# Patient Record
Sex: Female | Born: 1950 | Race: Black or African American | Hispanic: No | Marital: Single | Smoking: Current every day smoker
Health system: Southern US, Community
[De-identification: ages and names within clinical notes are randomized; demographics above are authoritative.]

## PROBLEM LIST (undated history)

## (undated) DIAGNOSIS — Z0000 Encounter for general adult medical examination without abnormal findings: Secondary | ICD-10-CM

## (undated) DIAGNOSIS — M1990 Unspecified osteoarthritis, unspecified site: Secondary | ICD-10-CM

## (undated) DIAGNOSIS — K219 Gastro-esophageal reflux disease without esophagitis: Secondary | ICD-10-CM

## (undated) DIAGNOSIS — E785 Hyperlipidemia, unspecified: Secondary | ICD-10-CM

## (undated) DIAGNOSIS — E041 Nontoxic single thyroid nodule: Secondary | ICD-10-CM

## (undated) DIAGNOSIS — M545 Low back pain, unspecified: Secondary | ICD-10-CM

## (undated) DIAGNOSIS — I10 Essential (primary) hypertension: Secondary | ICD-10-CM

## (undated) DIAGNOSIS — M2550 Pain in unspecified joint: Secondary | ICD-10-CM

## (undated) DIAGNOSIS — R1013 Epigastric pain: Secondary | ICD-10-CM

## (undated) DIAGNOSIS — Z833 Family history of diabetes mellitus: Secondary | ICD-10-CM

## (undated) DIAGNOSIS — R109 Unspecified abdominal pain: Secondary | ICD-10-CM

## (undated) DIAGNOSIS — F17200 Nicotine dependence, unspecified, uncomplicated: Secondary | ICD-10-CM

## (undated) DIAGNOSIS — H269 Unspecified cataract: Secondary | ICD-10-CM

## (undated) DIAGNOSIS — T7840XA Allergy, unspecified, initial encounter: Secondary | ICD-10-CM

## (undated) DIAGNOSIS — R10815 Periumbilic abdominal tenderness: Secondary | ICD-10-CM

## (undated) HISTORY — PX: POLYPECTOMY: SHX149

## (undated) HISTORY — DX: Essential (primary) hypertension: I10

## (undated) HISTORY — DX: Gastro-esophageal reflux disease without esophagitis: K21.9

## (undated) HISTORY — DX: Low back pain, unspecified: M54.50

## (undated) HISTORY — DX: Family history of diabetes mellitus: Z83.3

## (undated) HISTORY — DX: Encounter for general adult medical examination without abnormal findings: Z00.00

## (undated) HISTORY — DX: Hyperlipidemia, unspecified: E78.5

## (undated) HISTORY — DX: Nontoxic single thyroid nodule: E04.1

## (undated) HISTORY — PX: COLONOSCOPY: SHX174

## (undated) HISTORY — PX: ABDOMINAL HYSTERECTOMY: SHX81

## (undated) HISTORY — DX: Unspecified cataract: H26.9

## (undated) HISTORY — DX: Low back pain: M54.5

## (undated) HISTORY — DX: Allergy, unspecified, initial encounter: T78.40XA

## (undated) HISTORY — DX: Periumbilic abdominal tenderness: R10.815

## (undated) HISTORY — DX: Epigastric pain: R10.13

## (undated) HISTORY — PX: CHOLECYSTECTOMY: SHX55

## (undated) HISTORY — PX: OTHER SURGICAL HISTORY: SHX169

## (undated) HISTORY — DX: Nicotine dependence, unspecified, uncomplicated: F17.200

## (undated) HISTORY — DX: Pain in unspecified joint: M25.50

---

## 2005-03-30 ENCOUNTER — Emergency Department (HOSPITAL_COMMUNITY): Admission: EM | Admit: 2005-03-30 | Discharge: 2005-03-30 | Payer: Self-pay | Admitting: Emergency Medicine

## 2006-10-08 ENCOUNTER — Emergency Department (HOSPITAL_COMMUNITY): Admission: EM | Admit: 2006-10-08 | Discharge: 2006-10-08 | Payer: Self-pay | Admitting: Emergency Medicine

## 2010-08-15 ENCOUNTER — Encounter: Payer: Self-pay | Admitting: Emergency Medicine

## 2011-01-10 ENCOUNTER — Emergency Department (HOSPITAL_COMMUNITY)
Admission: EM | Admit: 2011-01-10 | Discharge: 2011-01-10 | Disposition: A | Discharge status: Home / Self Care | Payer: Self-pay | Attending: Emergency Medicine | Admitting: Emergency Medicine

## 2011-01-10 DIAGNOSIS — R51 Headache: Secondary | ICD-10-CM | POA: Insufficient documentation

## 2011-01-10 DIAGNOSIS — I10 Essential (primary) hypertension: Secondary | ICD-10-CM | POA: Insufficient documentation

## 2011-01-10 DIAGNOSIS — R609 Edema, unspecified: Secondary | ICD-10-CM | POA: Insufficient documentation

## 2012-09-03 ENCOUNTER — Other Ambulatory Visit: Payer: Self-pay | Admitting: *Deleted

## 2012-09-03 ENCOUNTER — Encounter: Payer: Self-pay | Admitting: Internal Medicine

## 2012-09-03 DIAGNOSIS — Z1231 Encounter for screening mammogram for malignant neoplasm of breast: Secondary | ICD-10-CM

## 2012-09-03 DIAGNOSIS — Z780 Asymptomatic menopausal state: Secondary | ICD-10-CM

## 2012-09-12 ENCOUNTER — Ambulatory Visit
Admission: RE | Admit: 2012-09-12 | Discharge: 2012-09-12 | Disposition: A | Discharge status: Home / Self Care | Payer: BC Managed Care – PPO | Source: Ambulatory Visit | Attending: Internal Medicine | Admitting: Internal Medicine

## 2012-09-12 ENCOUNTER — Other Ambulatory Visit: Payer: Self-pay | Admitting: Internal Medicine

## 2012-09-18 ENCOUNTER — Emergency Department (HOSPITAL_COMMUNITY)
Admission: EM | Admit: 2012-09-18 | Discharge: 2012-09-18 | Discharge status: Against Medical Advice | Payer: BC Managed Care – PPO | Attending: Emergency Medicine | Admitting: Emergency Medicine

## 2012-09-18 ENCOUNTER — Encounter (HOSPITAL_COMMUNITY): Payer: Self-pay | Admitting: Adult Health

## 2012-09-18 DIAGNOSIS — R1033 Periumbilical pain: Secondary | ICD-10-CM | POA: Insufficient documentation

## 2012-09-18 DIAGNOSIS — F17200 Nicotine dependence, unspecified, uncomplicated: Secondary | ICD-10-CM | POA: Insufficient documentation

## 2012-09-18 DIAGNOSIS — Z9089 Acquired absence of other organs: Secondary | ICD-10-CM | POA: Insufficient documentation

## 2012-09-18 DIAGNOSIS — I10 Essential (primary) hypertension: Secondary | ICD-10-CM | POA: Insufficient documentation

## 2012-09-18 DIAGNOSIS — R110 Nausea: Secondary | ICD-10-CM | POA: Insufficient documentation

## 2012-09-18 HISTORY — DX: Unspecified abdominal pain: R10.9

## 2012-09-18 HISTORY — DX: Unspecified osteoarthritis, unspecified site: M19.90

## 2012-09-18 HISTORY — DX: Essential (primary) hypertension: I10

## 2012-09-18 LAB — CBC WITH DIFFERENTIAL/PLATELET
Basophils Absolute: 0.1 10*3/uL (ref 0.0–0.1)
Basophils Relative: 1 % (ref 0–1)
Eosinophils Relative: 5 % (ref 0–5)
Lymphocytes Relative: 54 % — ABNORMAL HIGH (ref 12–46)
Lymphs Abs: 4 10*3/uL (ref 0.7–4.0)
MCH: 30.7 pg (ref 26.0–34.0)
Monocytes Relative: 9 % (ref 3–12)
Neutro Abs: 2.4 10*3/uL (ref 1.7–7.7)
Platelets: 324 10*3/uL (ref 150–400)
WBC: 7.4 10*3/uL (ref 4.0–10.5)

## 2012-09-18 LAB — COMPREHENSIVE METABOLIC PANEL
ALT: 9 U/L (ref 0–35)
AST: 13 U/L (ref 0–37)
CO2: 29 mEq/L (ref 19–32)
Calcium: 9.6 mg/dL (ref 8.4–10.5)
Chloride: 101 mEq/L (ref 96–112)
Creatinine, Ser: 0.75 mg/dL (ref 0.50–1.10)
GFR calc Af Amer: >90 mL/min (ref >90)
GFR calc non Af Amer: 88 mL/min — ABNORMAL LOW (ref >90)
Glucose, Bld: 123 mg/dL — ABNORMAL HIGH (ref 70–99)
Potassium: 3.7 mEq/L (ref 3.5–5.1)
Sodium: 139 mEq/L (ref 135–145)
Total Bilirubin: 0.4 mg/dL (ref 0.3–1.2)
Total Protein: 8 g/dL (ref 6.0–8.3)

## 2012-09-18 LAB — POCT I-STAT TROPONIN I: Troponin i, poc: 0 ng/mL (ref 0.00–0.08)

## 2012-09-18 NOTE — ED Notes (Signed)
No  Answer when called to triage

## 2012-09-18 NOTE — ED Notes (Signed)
Presents with abdominal pain at the umbilicus, denies radiation. Pain is described as "labor pains" and has been intermittent for 3 years. This episode began at 6 pm. Pain is associated with nausea. dnies diarrhea.

## 2012-10-03 ENCOUNTER — Ambulatory Visit
Admission: RE | Admit: 2012-10-03 | Discharge: 2012-10-03 | Disposition: A | Discharge status: Home / Self Care | Payer: BC Managed Care – PPO | Source: Ambulatory Visit | Attending: *Deleted | Admitting: *Deleted

## 2012-10-03 DIAGNOSIS — Z1231 Encounter for screening mammogram for malignant neoplasm of breast: Secondary | ICD-10-CM

## 2012-10-04 ENCOUNTER — Encounter: Payer: Self-pay | Admitting: Internal Medicine

## 2012-10-08 ENCOUNTER — Other Ambulatory Visit: Payer: Self-pay | Admitting: Internal Medicine

## 2012-10-11 ENCOUNTER — Ambulatory Visit (AMBULATORY_SURGERY_CENTER): Payer: BC Managed Care – PPO | Admitting: *Deleted

## 2012-10-11 ENCOUNTER — Encounter: Payer: Self-pay | Admitting: Internal Medicine

## 2012-10-11 VITALS — Ht 65.5 in | Wt 223.0 lb

## 2012-10-11 DIAGNOSIS — Z1211 Encounter for screening for malignant neoplasm of colon: Secondary | ICD-10-CM

## 2012-10-11 MED ORDER — PEG-KCL-NACL-NASULF-NA ASC-C 100 G PO SOLR: ORAL | Status: DC

## 2012-10-11 NOTE — Progress Notes (Signed)
Pt states she had a colonoscopy in [place] 10 years ago.  Doesn't remember where or who did the procedure.  No soy or egg allergy

## 2012-10-17 ENCOUNTER — Telehealth: Payer: Self-pay | Admitting: Internal Medicine

## 2012-10-17 NOTE — Telephone Encounter (Signed)
Pt will come by office (4th floor) 3/27 thur. And pick up $10 off coupon for MoviPrep

## 2012-10-18 ENCOUNTER — Ambulatory Visit (INDEPENDENT_AMBULATORY_CARE_PROVIDER_SITE_OTHER): Payer: BC Managed Care – PPO | Admitting: Internal Medicine

## 2012-10-18 ENCOUNTER — Encounter: Payer: Self-pay | Admitting: Internal Medicine

## 2012-10-18 ENCOUNTER — Other Ambulatory Visit: Payer: BC Managed Care – PPO

## 2012-10-18 VITALS — BP 116/62 | HR 64 | Temp 96.9°F | Resp 18 | Ht 64.5 in | Wt 224.0 lb

## 2012-10-18 DIAGNOSIS — K219 Gastro-esophageal reflux disease without esophagitis: Secondary | ICD-10-CM | POA: Insufficient documentation

## 2012-10-18 DIAGNOSIS — M1990 Unspecified osteoarthritis, unspecified site: Secondary | ICD-10-CM

## 2012-10-18 DIAGNOSIS — R739 Hyperglycemia, unspecified: Secondary | ICD-10-CM | POA: Insufficient documentation

## 2012-10-18 DIAGNOSIS — F17200 Nicotine dependence, unspecified, uncomplicated: Secondary | ICD-10-CM | POA: Insufficient documentation

## 2012-10-18 DIAGNOSIS — R7309 Other abnormal glucose: Secondary | ICD-10-CM

## 2012-10-18 DIAGNOSIS — I10 Essential (primary) hypertension: Secondary | ICD-10-CM | POA: Insufficient documentation

## 2012-10-18 DIAGNOSIS — R1013 Epigastric pain: Secondary | ICD-10-CM | POA: Insufficient documentation

## 2012-10-18 MED ORDER — LIDOCAINE HCL 1 % IJ SOLN: 1.0000 mL | Freq: Once | INTRAMUSCULAR | Status: DC

## 2012-10-18 MED ORDER — METHYLPREDNISOLONE ACETATE 80 MG/ML IJ SUSP: 80.0000 mg | Freq: Once | INTRAMUSCULAR | Status: DC

## 2012-10-18 NOTE — Progress Notes (Signed)
Subjective:    Patient ID: Rachel York, female    DOB: [birth year], 62 y.o.   MRN: [id number]  HPI 62 yo female who recently joined this practice was seen today for medical management of chronic issues.  She continues to have difficulty with left knee pain and knee giving out on her.  She is trying to lose weight.   See a/p portion of note for details. Has cscope next Tuesday and has to pay a lot of money for the prep despite her [employer] plan and was venting to me about this.   Also has diagnostic mammogram and ultrasound coming up after abnormality on screening.   Has fh/o diabetes.  Says boyfriend cooks for her since she has gotten quite motivated by her elevated hba1c result.     Review of Systems  Constitutional: Positive for activity change. Negative for appetite change, fatigue and unexpected weight change.  HENT: Negative for congestion and sinus pressure.   Eyes: Negative for visual disturbance.  Respiratory: Negative for cough, chest tightness and shortness of breath.   Cardiovascular: Negative for chest pain, palpitations and leg swelling.  Gastrointestinal: Negative for nausea, vomiting, diarrhea, constipation and abdominal distention.  Endocrine: Positive for polyphagia. Negative for polydipsia and polyuria.  Genitourinary: Negative for dysuria, urgency and difficulty urinating.  Musculoskeletal: Positive for back pain, joint swelling and gait problem.  Skin: Negative for rash.  Allergic/Immunologic: Negative for immunocompromised state.  Neurological: Negative for dizziness, weakness and headaches.  Hematological: Does not bruise/bleed easily.  Psychiatric/Behavioral: Negative for confusion. The patient is not nervous/anxious.        Objective:   Physical Exam  Vitals reviewed. Constitutional: She is oriented to person, place, and time. She appears well-developed and well-nourished.  Overweight AA female in NAD  HENT:  Head: Normocephalic and atraumatic.   Cardiovascular: Normal rate, regular rhythm, normal heart sounds and intact distal pulses.   Pulmonary/Chest: Effort normal and breath sounds normal.  Abdominal: Soft. Bowel sounds are normal. She exhibits no distension and no mass. There is no tenderness.  Musculoskeletal: She exhibits tenderness. She exhibits no edema.  Midline tenderness of left knee. Consent obtained--risks benefits, alternatives discussed,  area was cleansed and sprayed with bupivicaine and left knee steroid injection performed with depomedrol and lidocaine 1%  1cc each medial approach.  Pt noted some immediate relief.  Neurological: She is alert and oriented to person, place, and time.  Skin: Skin is warm and dry.  Psychiatric: She has a normal mood and affect. Her behavior is normal. Judgment and thought content normal.      Assessment & Plan:  GERD (gastroesophageal reflux disease) prilosec only relieves intensity of her pain in her abdomen.   Essential hypertension, benign Sprint study wants bp in 140 range.  With 6.25mg  hctz makes her feel worse.  Feels better and less edema when bp is lower like 117.  Feels better with 12.5mg .  Will continue microzide 12.5mg  po daily as she was actually taking at home.  Abdominal pain, epigastric Has had her abdominal pain 2-3 years now.  Sometimes feels like there's a lump in her abdomen.  H pylori test was negative.  prilosec not very helpful.    Osteoarthritis Left knee has tricompartmental arthritis (moderate).   Tramadol 50mg  twice a day is working well for her pain.   Does want steroid injection in left knee.   Hyperglycemia Has changed her eating habits.  Joined [employer].  Lost 7 lbs since last visit.   Tobacco  use disorder Is considering quitting smoking.  Likes the calming effect of smoking.  Says once she gets under 220lb, she will consider quitting.

## 2012-10-18 NOTE — Assessment & Plan Note (Addendum)
Sprint study wants bp in 140 range.  With 6.25mg  hctz makes her feel worse.  Feels better and less edema when bp is lower like 117.  Feels better with 12.5mg .  Will continue microzide 12.5mg  po daily as she was actually taking at home.

## 2012-10-18 NOTE — Assessment & Plan Note (Signed)
prilosec only relieves intensity of her pain in her abdomen.

## 2012-10-18 NOTE — Assessment & Plan Note (Addendum)
Left knee has tricompartmental arthritis (moderate).   Tramadol 50mg  twice a day is working well for her pain.   Does want steroid injection in left knee.

## 2012-10-18 NOTE — Assessment & Plan Note (Signed)
Has changed her eating habits.  Joined [employer].  Lost 7 lbs since last visit.

## 2012-10-18 NOTE — Assessment & Plan Note (Signed)
Has had her abdominal pain 2-3 years now.  Sometimes feels like there's a lump in her abdomen.  H pylori test was negative.  prilosec not very helpful.

## 2012-10-18 NOTE — Assessment & Plan Note (Signed)
Is considering quitting smoking.  Likes the calming effect of smoking.  Says once she gets under 220lb, she will consider quitting.

## 2012-10-19 ENCOUNTER — Ambulatory Visit
Admission: RE | Admit: 2012-10-19 | Discharge: 2012-10-19 | Disposition: A | Discharge status: Home / Self Care | Payer: BC Managed Care – PPO | Source: Ambulatory Visit | Attending: Internal Medicine | Admitting: Internal Medicine

## 2012-10-19 DIAGNOSIS — R928 Other abnormal and inconclusive findings on diagnostic imaging of breast: Secondary | ICD-10-CM

## 2012-10-22 ENCOUNTER — Encounter: Payer: Self-pay | Admitting: Internal Medicine

## 2012-10-23 ENCOUNTER — Ambulatory Visit (AMBULATORY_SURGERY_CENTER): Payer: BC Managed Care – PPO | Admitting: Internal Medicine

## 2012-10-23 ENCOUNTER — Encounter: Payer: Self-pay | Admitting: Internal Medicine

## 2012-10-23 VITALS — BP 143/94 | HR 57 | Temp 96.3°F | Resp 31 | Ht 65.5 in | Wt 223.0 lb

## 2012-10-23 DIAGNOSIS — D126 Benign neoplasm of colon, unspecified: Secondary | ICD-10-CM

## 2012-10-23 DIAGNOSIS — Z1211 Encounter for screening for malignant neoplasm of colon: Secondary | ICD-10-CM

## 2012-10-23 MED ORDER — SODIUM CHLORIDE 0.9 % IV SOLN: 500.0000 mL | INTRAVENOUS | Status: DC

## 2012-10-23 NOTE — Patient Instructions (Addendum)
YOU HAD AN ENDOSCOPIC PROCEDURE TODAY AT THE [facility]: Refer to the procedure report that was given to you for any specific questions about what was found during the examination.  If the procedure report does not answer your questions, please call your gastroenterologist to clarify.  If you requested that your care partner not be given the details of your procedure findings, then the procedure report has been included in a sealed envelope for you to review at your convenience later.  YOU SHOULD EXPECT: Some feelings of bloating in the abdomen. Passage of more gas than usual.  Walking can help get rid of the air that was put into your GI tract during the procedure and reduce the bloating. If you had a lower endoscopy (such as a colonoscopy or flexible sigmoidoscopy) you may notice spotting of blood in your stool or on the toilet paper. If you underwent a bowel prep for your procedure, then you may not have a normal bowel movement for a few days.  DIET: Your first meal following the procedure should be a light meal and then it is ok to progress to your normal diet.  A half-sandwich or bowl of soup is an example of a good first meal.  Heavy or fried foods are harder to digest and may make you feel nauseous or bloated.  Likewise meals heavy in dairy and vegetables can cause extra gas to form and this can also increase the bloating.  Drink plenty of fluids but you should avoid alcoholic beverages for 24 hours.  ACTIVITY: Your care partner should take you home directly after the procedure.  You should plan to take it easy, moving slowly for the rest of the day.  You can resume normal activity the day after the procedure however you should NOT DRIVE or use heavy machinery for 24 hours (because of the sedation medicines used during the test).    SYMPTOMS TO REPORT IMMEDIATELY: A gastroenterologist can be reached at any hour.  During normal business hours, 8:30 AM to 5:00 PM Monday through Friday,  call [phone].  After hours and on weekends, please call the GI answering service at [phone] emergency number  who will take a message and have the physician on call contact you.   Following lower endoscopy (colonoscopy or flexible sigmoidoscopy):  Excessive amounts of blood in the stool  Significant tenderness or worsening of abdominal pains  Swelling of the abdomen that is new, acute  Fever of 100F or higher  FOLLOW UP: If any biopsies were taken you will be contacted by phone or by letter within the next 1-3 weeks.  Call your gastroenterologist if you have not heard about the biopsies in 3 weeks.  Our staff will call the home number listed on your records the next business day following your procedure to check on you and address any questions or concerns that you may have at that time regarding the information given to you following your procedure. This is a courtesy call and so if there is no answer at the home number and we have not heard from you through the emergency physician on call, we will assume that you have returned to your regular daily activities without incident.  SIGNATURES/CONFIDENTIALITY: You and/or your care partner have signed paperwork which will be entered into your electronic medical record.  These signatures attest to the fact that that the information above on your After Visit Summary has been reviewed and is understood.  Full responsibility of the   confidentiality of this discharge information lies with you and/or your care-partner.  Handout on polyps  Strictly hold all aspirins, aspirin containing products, and all anti inflammatory medicines like advil, motrin, aleve or any prescription medicines for 2 weeks. Only take tylenol for pain x 2 weeks. You may resume these medicines on 11-06-12.  Please call [phone] in the next 1-3 days to schedule an office visit with Dr Rhea Belton in 2-3 weeks.  If polyps are adenomatous, you will need genetic testing. If this  is needed, dr pyrtle's office will call you.

## 2012-10-23 NOTE — Progress Notes (Signed)
Patient did not experience any of the following events: a burn prior to discharge; a fall within the facility; wrong site/side/patient/procedure/implant event; or a hospital transfer or hospital admission upon discharge from the facility. (G8907)Patient did not have preoperative order for IV antibiotic SSI prophylaxis. (G8918) ewm 

## 2012-10-23 NOTE — Op Note (Signed)
[facility] [address] [place], [address]   COLONOSCOPY PROCEDURE REPORT  PATIENT: Rachel, York  MR#: [id number] BIRTHDATE: 1950/09/24 , 62  yrs. old GENDER: Female ENDOSCOPIST: Beverley Fiedler, MD REFERRED FA:OZHY, Tiffany PROCEDURE DATE:  10/23/2012 PROCEDURE:   Colonoscopy with snare polypectomy ASA CLASS:   Class II INDICATIONS:elevated risk screening, Patient's personal history of colon polyps, and Last colonoscopy performed 10 years ago. MEDICATIONS: MAC sedation, administered by CRNA and propofol (Diprivan) 500mg  IV  DESCRIPTION OF PROCEDURE:   After the risks benefits and alternatives of the procedure were thoroughly explained, informed consent was obtained.  A digital rectal exam revealed no rectal mass.   The LB [id number]  endoscope was introduced through the anus and advanced to the cecum, which was identified by both the appendix and ileocecal valve. No adverse events experienced. The quality of the prep was good, using MoviPrep  The instrument was then slowly withdrawn as the colon was fully examined.      COLON FINDINGS: A sessile polyp measuring 5 mm in size was found at the cecum.  A polypectomy was performed with a cold snare.  The resection was complete and the polyp tissue was completely retrieved.   Nine sessile polyps measuring 5-10 mm in size were found in the transverse colon.  Polypectomy was performed using cold snare (8) and using hot snare (1).  All resections were complete and all polyp tissue was completely retrieved.   Twelve sessile polyps measuring 5-12 mm in size were found in the descending colon, sigmoid colon, and rectum.  Polypectomy was performed using cold snare (11) and using hot snare (1 sigmoid). All resections were complete and all polyp tissue was completely retrieved.   An innumerable number of sessile polyps measuring 3-7 mm in size were found in the transverse colon, descending colon, sigmoid  colon, and rectum (too numerous to remove today/polypectomy not attempted).  Retroflexed views revealed no abnormalities. The time to cecum=4 minutes 32 seconds.  Withdrawal time=32 minutes 00 seconds.  The scope was withdrawn and the procedure completed.  COMPLICATIONS: There were no complications.  ENDOSCOPIC IMPRESSION: 1.   Sessile polyp measuring 5 mm in size was found at the cecum; polypectomy was performed with a cold snare 2.   Nine sessile polyps measuring 5-10 mm in size were found in the transverse colon; Polypectomy was performed using cold snare and using hot snare 3.   Twelve sessile polyps measuring 5-12 mm in size were found in the descending colon, sigmoid colon, and rectum; Polypectomy was performed using cold snare and using hot snare 4.   Innumerable number of sessile polyps measuring 3-7 mm in size were found in the transverse colon, descending colon, sigmoid colon, and rectum; polypectomy not attempted  RECOMMENDATIONS: 1.  Strictly hold aspirin, aspirin products, and anti-inflammatory medication for 2 weeks. 2.  Await pathology results 3.  Timing of repeat colonoscopy will be determined by pathology findings. 4.  You will receive a letter within 1-2 weeks with the results of your biopsy as well as final recommendations.  Please call my office if you have not received a letter after 3 weeks. 5.  If polyps adenomatous, the patient will need of medical genetics referral 6.  Office followup in 2-3 weeks   eSigned:  Beverley Fiedler, MD 10/23/2012 11:27 AM   cc: The Patient   PATIENT NAME:  Rachel, York MR#: [id number]

## 2012-10-24 ENCOUNTER — Telehealth: Payer: Self-pay | Admitting: *Deleted

## 2012-10-24 NOTE — Telephone Encounter (Signed)
  Follow up Call-  Call back number 10/23/2012  Post procedure Call Back phone  # [phone]  Permission to leave phone message Yes     Patient questions:  Do you have a fever, pain , or abdominal swelling? no Pain Score  0 *  Have you tolerated food without any problems? yes  Have you been able to return to your normal activities? yes  Do you have any questions about your discharge instructions: Diet   no Medications  no Follow up visit  no  Do you have questions or concerns about your Care? no  Actions: * If pain score is 4 or above: No action needed, pain <4.

## 2012-10-31 ENCOUNTER — Other Ambulatory Visit: Payer: Self-pay | Admitting: *Deleted

## 2012-10-31 ENCOUNTER — Encounter: Payer: Self-pay | Admitting: Internal Medicine

## 2012-11-01 NOTE — Progress Notes (Signed)
Colonoscopy with multiple polyps removed and some still left behind.  Await pathology to determine when follow up needed.

## 2012-11-05 ENCOUNTER — Telehealth: Payer: Self-pay | Admitting: *Deleted

## 2012-11-05 DIAGNOSIS — D369 Benign neoplasm, unspecified site: Secondary | ICD-10-CM

## 2012-11-05 DIAGNOSIS — Z86010 Personal history of colonic polyps: Secondary | ICD-10-CM

## 2012-11-05 NOTE — Telephone Encounter (Signed)
Letter from: Beverley Fiedler   Reason for Letter: Results Review   Comments: Recall colonoscopy to be discussed in clinic 11/02/12 sent JLR   Aram Beecham, medical genetics referral   lmom for pt to call back to discuss Genetics Referral

## 2012-11-06 ENCOUNTER — Telehealth: Payer: Self-pay | Admitting: Genetic Counselor

## 2012-11-06 NOTE — Telephone Encounter (Signed)
S/W PT IN RE TO GENETIC APPT 05/14 @ 10 W/CATHERINE FINE.  WELCOME PACKET MAILED.

## 2012-11-06 NOTE — Telephone Encounter (Signed)
Discussed genetic referral with the pt and the need; pt stated understanding.

## 2012-11-22 ENCOUNTER — Encounter: Payer: Self-pay | Admitting: Internal Medicine

## 2012-11-28 ENCOUNTER — Ambulatory Visit (INDEPENDENT_AMBULATORY_CARE_PROVIDER_SITE_OTHER): Payer: BC Managed Care – PPO | Admitting: Internal Medicine

## 2012-11-28 ENCOUNTER — Encounter: Payer: Self-pay | Admitting: Internal Medicine

## 2012-11-28 VITALS — BP 100/64 | HR 64 | Ht 65.5 in | Wt 220.4 lb

## 2012-11-28 DIAGNOSIS — Z860101 Personal history of adenomatous and serrated colon polyps: Secondary | ICD-10-CM | POA: Insufficient documentation

## 2012-11-28 DIAGNOSIS — Z7189 Other specified counseling: Secondary | ICD-10-CM

## 2012-11-28 DIAGNOSIS — Z716 Tobacco abuse counseling: Secondary | ICD-10-CM

## 2012-11-28 DIAGNOSIS — F17200 Nicotine dependence, unspecified, uncomplicated: Secondary | ICD-10-CM

## 2012-11-28 DIAGNOSIS — Z86010 Personal history of colonic polyps: Secondary | ICD-10-CM | POA: Insufficient documentation

## 2012-11-28 NOTE — Patient Instructions (Addendum)
Dr. Rhea Belton recommends you have a repeat colonoscopy in September 2014  Please have Genetics send their office note to Dr. Rhea Belton they can fax it to : [phone]                                               We are excited to introduce MyChart, a new best-in-class service that provides you online access to important information in your electronic medical record. We want to make it easier for you to view your health information - all in one secure location - when and where you need it. We expect MyChart will enhance the quality of care and service we provide.  When you register for MyChart, you can:    View your test results.    Request appointments and receive appointment reminders via email.    Request medication renewals.    View your medical history, allergies, medications and immunizations.    Communicate with your physician's office through a password-protected site.    Conveniently print information such as your medication lists.  To find out if MyChart is right for you, please talk to a member of our clinical staff today. We will gladly answer your questions about this free health and wellness tool.  If you are age 93 or older and want a member of your family to have access to your record, you must provide written consent by completing a proxy form available at our office. Please speak to our clinical staff about guidelines regarding accounts for patients younger than age 79.  As you activate your MyChart account and need any technical assistance, please call the MyChart technical support line at [phone]) or email your question to [email or web address]. If you email your question(s), please include your name, a return phone number and the best time to reach you.  If you have non-urgent health-related questions, you can send a message to our office through MyChart at [email or web address]. If you have a medical emergency, call 911.  Thank you for using  MyChart as your new health and wellness resource!   MyChart licensed from [employer],  [id number]. Patents Pending.

## 2012-11-28 NOTE — Progress Notes (Signed)
Patient ID: Rachel York, female   DOB: 16-Jan-1951, 62 y.o.   MRN: [id number] HPI: Rachel York is a 62 year old female with a past medical history of GERD, tobacco use, hypertension, and now adenomatous colon polyps who is seen after undergoing a screening colonoscopy in April 2014. She is here today with her daughter and husband. She reports she feels well. She denies abdominal pain. No change in her bowel habits, and she is having 1-2 formed bowel movements daily. No rectal bleeding or melena. No nausea or vomiting. Good appetite. She denies trouble with heartburn. No trouble swallowing. She does report for about 3 years lower abdominal pain which was cramping in nature. This was episodic, but when occurring last at 4-5 days. She describes this pain as severe and at times "doubling over". This pain has been absent since her colonoscopy.  Colonoscopy performed on 10/23/2012 revealed multiple polyps throughout the colon. 22 polyps were removed measuring 5-12 mm in size throughout the colon. There were also innumerable sessile polyps 3-7 cm found in the colon which were not removed due to the length of procedure and multiplicity of polyps. She was referred for genetics evaluation and this is pending for 12/05/2012.  She reports no known history of colon cancer. She has no siblings and 2 kids.  She did recall a colonoscopy greater than 10 years ago and wrote of Rachel York. She had multiple polyps at that time but was told they were not cancer.  Patient Active Problem List   Diagnosis Date Noted  . Hx of adenomatous colonic polyps 11/28/2012  . GERD (gastroesophageal reflux disease) 10/18/2012  . Abdominal pain, epigastric 10/18/2012  . Osteoarthritis 10/18/2012  . Tobacco use disorder 10/18/2012  . Essential hypertension, benign 10/18/2012  . Hyperglycemia 10/18/2012    Past Surgical History  Procedure Laterality Date  . Cholecystectomy    . Abdominal hysterectomy    . Colonoscopy      Current  Outpatient Prescriptions  Medication Sig Dispense Refill  . hydrochlorothiazide (MICROZIDE) 12.5 MG capsule Take 6.25 mg by mouth daily.      Marland Kitchen omeprazole (PRILOSEC) 20 MG capsule Take 20 mg by mouth daily.       No current facility-administered medications for this visit.    No Known Allergies  Family History  Problem Relation Age of Onset  . Colon cancer Neg Hx   . Esophageal cancer Neg Hx   . Rectal cancer Neg Hx   . Stomach cancer Neg Hx   . Cancer Mother   . Diabetes Father     History  Substance Use Topics  . Smoking status: Current Every Day Smoker -- 0.50 packs/day    Types: Cigarettes  . Smokeless tobacco: Never Used  . Alcohol Use: No    ROS: As per history of present illness, otherwise negative  BP 100/64  Pulse 64  Ht 5' 5.5" (1.664 m)  Wt 220 lb 6.4 oz (99.973 kg)  BMI 36.11 kg/m2 Constitutional: Well-developed and well-nourished. No distress. HEENT: Normocephalic and atraumatic. No scleral icterus. Abdominal: Soft, nontender, nondistended. Bowel sounds active throughout. There are no masses palpable. No hepatosplenomegaly. Extremities: no clubbing, cyanosis, or edema Neurological: Alert and oriented to person place and time. Skin: Skin is warm and dry. No rashes noted. Psychiatric: Normal mood and affect. Behavior is normal.  RELEVANT LABS AND IMAGING: CBC    Component Value Date/Time   WBC 7.4 09/18/2012 2036   RBC 5.11 09/18/2012 2036   HGB 15.7* 09/18/2012 2036   HCT 43.8  09/18/2012 2036   PLT 324 09/18/2012 2036   MCV 85.7 09/18/2012 2036   MCH 30.7 09/18/2012 2036   MCHC 35.8 09/18/2012 2036   RDW 13.3 09/18/2012 2036   LYMPHSABS 4.0 09/18/2012 2036   MONOABS 0.7 09/18/2012 2036   EOSABS 0.3 09/18/2012 2036   BASOSABS 0.1 09/18/2012 2036    CMP     Component Value Date/Time   NA 139 09/18/2012 2036   K 3.7 09/18/2012 2036   CL 101 09/18/2012 2036   CO2 29 09/18/2012 2036   GLUCOSE 123* 09/18/2012 2036   BUN 15 09/18/2012 2036   CREATININE 0.75  09/18/2012 2036   CALCIUM 9.6 09/18/2012 2036   PROT 8.0 09/18/2012 2036   ALBUMIN 3.6 09/18/2012 2036   AST 13 09/18/2012 2036   ALT 9 09/18/2012 2036   ALKPHOS 100 09/18/2012 2036   BILITOT 0.4 09/18/2012 2036   GFRNONAA 88* 09/18/2012 2036   GFRAA >90 09/18/2012 2036    ASSESSMENT/PLAN:  62 year old female with a past medical history of GERD, tobacco use, hypertension, and now adenomatous colon polyps who is seen after undergoing a screening colonoscopy in April 2014.   1.  Hx of multiple adenomatous colon polyps -- we had a long and thorough discussion concerning colon polyps. She is aware that she had multiple adenomatous polyps but none with high-grade dysplasia or cancer. She also is aware that there were multiple polyps remaining in her colon which were not resected at her last colonoscopy. She does plan to meet with genetics to investigate the possibility of hereditary polyposis syndrome. We discussed options for dealing with her colon polyps which include frequent surveillance colonoscopies, though with this option it would be unlikely that all polyps could be removed. We discussed that it is difficult to tell endoscopically which polyps are adenomatous, serrated, versus hyperplastic.  We did discuss colectomy, which would be the only way to definitely remove all of her colon polyps. She asked questions regarding the need for an ostomy, and should she elect this option in the future, subtotal colectomy with ileorectal anastomosis would likely be sufficient. She is aware if this were done we would need to periodically survey her rectum for polyps.  She does smoke, and I strongly encouraged her to stop smoking is correlated with colon polyps. I recommended that her children get screening colonoscopies, beginning at age 105. Her kids may need earlier screening if she is found to have a genetic polyp syndrome.   --For now we will plan a repeat colonoscopy for polyp surveillance in 3-6 months. This will  allow time for her genetic counseling. She will call me with any questions or concerns before this.  2.  Lower abd pain -- unclear why this resolved after colonoscopy. For now she is thankful that the pain is gone, and will let us know if it returns

## 2012-12-05 ENCOUNTER — Other Ambulatory Visit: Payer: BC Managed Care – PPO | Admitting: Lab

## 2012-12-11 ENCOUNTER — Other Ambulatory Visit: Payer: Self-pay | Admitting: Internal Medicine

## 2012-12-11 DIAGNOSIS — N6310 Unspecified lump in the right breast, unspecified quadrant: Secondary | ICD-10-CM

## 2012-12-13 ENCOUNTER — Ambulatory Visit: Payer: BC Managed Care – PPO | Admitting: Internal Medicine

## 2012-12-19 ENCOUNTER — Encounter: Payer: Self-pay | Admitting: *Deleted

## 2012-12-19 DIAGNOSIS — E041 Nontoxic single thyroid nodule: Secondary | ICD-10-CM | POA: Insufficient documentation

## 2012-12-20 ENCOUNTER — Encounter: Payer: Self-pay | Admitting: Internal Medicine

## 2012-12-20 ENCOUNTER — Ambulatory Visit: Payer: Self-pay | Admitting: Internal Medicine

## 2012-12-20 ENCOUNTER — Ambulatory Visit (INDEPENDENT_AMBULATORY_CARE_PROVIDER_SITE_OTHER): Payer: BC Managed Care – PPO | Admitting: Internal Medicine

## 2012-12-20 VITALS — BP 140/78 | HR 88 | Temp 98.5°F | Resp 14 | Ht 65.5 in | Wt 222.0 lb

## 2012-12-20 DIAGNOSIS — K219 Gastro-esophageal reflux disease without esophagitis: Secondary | ICD-10-CM

## 2012-12-20 DIAGNOSIS — F17200 Nicotine dependence, unspecified, uncomplicated: Secondary | ICD-10-CM

## 2012-12-20 DIAGNOSIS — M1990 Unspecified osteoarthritis, unspecified site: Secondary | ICD-10-CM

## 2012-12-20 DIAGNOSIS — Z860100 Personal history of colon polyps, unspecified: Secondary | ICD-10-CM

## 2012-12-20 DIAGNOSIS — R739 Hyperglycemia, unspecified: Secondary | ICD-10-CM

## 2012-12-20 DIAGNOSIS — E041 Nontoxic single thyroid nodule: Secondary | ICD-10-CM

## 2012-12-20 DIAGNOSIS — I10 Essential (primary) hypertension: Secondary | ICD-10-CM

## 2012-12-20 DIAGNOSIS — R7309 Other abnormal glucose: Secondary | ICD-10-CM

## 2012-12-20 NOTE — Patient Instructions (Signed)
Diabetes Meal Planning Guide The diabetes meal planning guide is a tool to help you plan your meals and snacks. It is important for people with diabetes to manage their blood glucose (sugar) levels. Choosing the right foods and the right amounts throughout your day will help control your blood glucose. Eating right can even help you improve your blood pressure and reach or maintain a healthy weight. CARBOHYDRATE COUNTING MADE EASY When you eat carbohydrates, they turn to sugar. This raises your blood glucose level. Counting carbohydrates can help you control this level so you feel better. When you plan your meals by counting carbohydrates, you can have more flexibility in what you eat and balance your medicine with your food intake. Carbohydrate counting simply means adding up the total amount of carbohydrate grams in your meals and snacks. Try to eat about the same amount at each meal. Foods with carbohydrates are listed below. Each portion below is 1 carbohydrate serving or 15 grams of carbohydrates. Ask your dietician how many grams of carbohydrates you should eat at each meal or snack. Grains and Starches  1 slice bread.   English muffin or hotdog/hamburger bun.   cup cold cereal (unsweetened).   cup cooked pasta or rice.   cup starchy vegetables (corn, potatoes, peas, beans, winter squash).  1 tortilla (6 inches).   bagel.  1 waffle or pancake (size of a CD).   cup cooked cereal.  4 to 6 small crackers. *Whole grain is recommended. Fruit  1 cup fresh unsweetened berries, melon, papaya, pineapple.  1 small fresh fruit.   banana or mango.   cup fruit juice (4 oz unsweetened).   cup canned fruit in natural juice or water.  2 tbs dried fruit.  12 to 15 grapes or cherries. Milk and Yogurt  1 cup fat-free or 1% milk.  1 cup soy milk.  6 oz light yogurt with sugar-free sweetener.  6 oz low-fat soy yogurt.  6 oz plain yogurt. Vegetables  1 cup raw or  cup  cooked is counted as 0 carbohydrates or a "free" food.  If you eat 3 or more servings at 1 meal, count them as 1 carbohydrate serving. Other Carbohydrates   oz chips or pretzels.   cup ice cream or frozen yogurt.   cup sherbet or sorbet.  2 inch square cake, no frosting.  1 tbs honey, sugar, jam, jelly, or syrup.  2 small cookies.  3 squares of graham crackers.  3 cups popcorn.  6 crackers.  1 cup broth-based soup.  Count 1 cup casserole or other mixed foods as 2 carbohydrate servings.  Foods with less than 20 calories in a serving may be counted as 0 carbohydrates or a "free" food. You may want to purchase a book or computer software that lists the carbohydrate gram counts of different foods. In addition, the nutrition facts panel on the labels of the foods you eat are a good source of this information. The label will tell you how big the serving size is and the total number of carbohydrate grams you will be eating per serving. Divide this number by 15 to obtain the number of carbohydrate servings in a portion. Remember, 1 carbohydrate serving equals 15 grams of carbohydrate. SERVING SIZES Measuring foods and serving sizes helps you make sure you are getting the right amount of food. The list below tells how big or small some common serving sizes are.  1 oz.........4 stacked dice.  3 oz.........Deck of cards.  1 tsp........Tip   of little finger.  1 tbs........Thumb.  2 tbs........Golf ball.   cup.......Half of a fist.  1 cup........A fist. SAMPLE DIABETES MEAL PLAN Below is a sample meal plan that includes foods from the grain and starches, dairy, vegetable, fruit, and meat groups. A dietician can individualize a meal plan to fit your calorie needs and tell you the number of servings needed from each food group. However, controlling the total amount of carbohydrates in your meal or snack is more important than making sure you include all of the food groups at every  meal. You may interchange carbohydrate containing foods (dairy, starches, and fruits). The meal plan below is an example of a 2000 calorie diet using carbohydrate counting. This meal plan has 17 carbohydrate servings. Breakfast  1 cup oatmeal (2 carb servings).   cup light yogurt (1 carb serving).  1 cup blueberries (1 carb serving).   cup almonds. Snack  1 large apple (2 carb servings).  1 low-fat string cheese stick. Lunch  Chicken breast salad.  1 cup spinach.   cup chopped tomatoes.  2 oz chicken breast, sliced.  2 tbs low-fat Italian dressing.  12 whole-wheat crackers (2 carb servings).  12 to 15 grapes (1 carb serving).  1 cup low-fat milk (1 carb serving). Snack  1 cup carrots.   cup hummus (1 carb serving). Dinner  3 oz broiled salmon.  1 cup brown rice (3 carb servings). Snack  1  cups steamed broccoli (1 carb serving) drizzled with 1 tsp olive oil and lemon juice.  1 cup light pudding (2 carb servings). DIABETES MEAL PLANNING WORKSHEET Your dietician can use this worksheet to help you decide how many servings of foods and what types of foods are right for you.  BREAKFAST Food Group and Servings / Carb Servings Grain/Starches __________________________________ Dairy __________________________________________ Vegetable ______________________________________ Fruit ___________________________________________ Meat __________________________________________ Fat ____________________________________________ LUNCH Food Group and Servings / Carb Servings Grain/Starches ___________________________________ Dairy ___________________________________________ Fruit ____________________________________________ Meat ___________________________________________ Fat _____________________________________________ DINNER Food Group and Servings / Carb Servings Grain/Starches ___________________________________ Dairy  ___________________________________________ Fruit ____________________________________________ Meat ___________________________________________ Fat _____________________________________________ SNACKS Food Group and Servings / Carb Servings Grain/Starches ___________________________________ Dairy ___________________________________________ Vegetable _______________________________________ Fruit ____________________________________________ Meat ___________________________________________ Fat _____________________________________________ DAILY TOTALS Starches _________________________ Vegetable ________________________ Fruit ____________________________ Dairy ____________________________ Meat ____________________________ Fat ______________________________ Document Released: 04/07/2005 Document Revised: 10/03/2011 Document Reviewed: 02/16/2009 [employer] Patient Information 2014 [employer]. Diets for Diabetes, Food Labeling Look at food labels to help you decide how much of a product you can eat. You will want to check the amount of total carbohydrate in a serving to see how the food fits into your meal plan. In the list of ingredients, the ingredient present in the largest amount by weight must be listed first, followed by the other ingredients in descending order. STANDARD OF IDENTITY Most products have a list of ingredients. However, foods that the Food and Drug Administration (FDA) has given a standard of identity do not need a list of ingredients. A standard of identity means that a food must contain certain ingredients if it is called a particular name. Examples are mayonnaise, peanut butter, ketchup, jelly, and cheese. LABELING TERMS There are many terms found on food labels. Some of these terms have specific definitions. Some terms are regulated by the FDA, and the FDA has clearly specified how they can be used. Others are not regulated or well-defined and can be misleading and  confusing. SPECIFICALLY DEFINED TERMS Nutritive Sweetener.  A sweetener that contains calories,such as table sugar or   honey. Nonnutritive Sweetener.  A sweetener with few or no calories,such as saccharin, aspartame, sucralose, and cyclamate. LABELING TERMS REGULATED BY THE FDA Free.  The product contains only a tiny or small amount of fat, cholesterol, sodium, sugar, or calories. For example, a "fat-free" product will contain less than 0.5 g of fat per serving. Low.  A food described as "low" in fat, saturated fat, cholesterol, sodium, or calories could be eaten fairly often without exceeding dietary guidelines. For example, "low in fat" means no more than 3 g of fat per serving. Lean.  "Lean" and "extra lean" are U.S. Department of Agriculture (USDA) terms for use on meat and poultry products. "Lean" means the product contains less than 10 g of fat, 4 g of saturated fat, and 95 mg of cholesterol per serving. "Lean" is not as low in fat as a product labeled "low." Extra Lean.  "Extra lean" means the product contains less than 5 g of fat, 2 g of saturated fat, and 95 mg of cholesterol per serving. While "extra lean" has less fat than "lean," it is still higher in fat than a product labeled "low." Reduced, Less, Fewer.  A diet product that contains 25% less of a nutrient or calories than the regular version. For example, hot dogs might be labeled "25% less fat than our regular hot dogs." Light/Lite.  A diet product that contains  fewer calories or  the fat of the original. For example, "light in sodium" means a product with  the usual sodium. More.  One serving contains at least 10% more of the daily value of a vitamin, mineral, or fiber than usual. Good Source Of.  One serving contains 10% to 19% of the daily value for a particular vitamin, mineral, or fiber. Excellent Source Of.  One serving contains 20% or more of the daily value for a particular nutrient. Other terms used might  be "high in" or "rich in." Enriched or Fortified.  The product contains added vitamins, minerals, or protein. Nutrition labeling must be used on enriched or fortified foods. Imitation.  The product has been altered so that it is lower in protein, vitamins, or minerals than the usual food,such as imitation peanut butter. Total Fat.  The number listed is the total of all fat found in a serving of the product. Under total fat, food labels must list saturated fat and trans fat, which are associated with raising bad cholesterol and an increased risk of heart blood vessel disease. Saturated Fat.  Mainly fats from animal-based sources. Some examples are red meat, cheese, cream, whole milk, and coconut oil. Trans Fat.  Found in some fried snack foods, packaged foods, and fried restaurant foods. It is recommended you eat as close to 0 g of trans fat as possible, since it raises bad cholesterol and lowers good cholesterol. Polyunsaturated and Monounsaturated Fats.  More healthful fats. These fats are from plant sources. Total Carbohydrate.  The number of carbohydrate grams in a serving of the product. Under total carbohydrate are listed the other carbohydrate sources, such as dietary fiber and sugars. Dietary Fiber.  A carbohydrate from plant sources. Sugars.  Sugars listed on the label contain all naturally occurring sugars as well as added sugars. LABELING TERMS NOT REGULATED BY THE FDA Sugarless.  Table sugar (sucrose) has not been added. However, the manufacturer may use another form of sugar in place of sucrose to sweeten the product. For example, sugar alcohols are used to sweeten foods. Sugar alcohols are a form   of sugar but are not table sugar. If a product contains sugar alcohols in place of sucrose, it can still be labeled "sugarless." Low Salt, Salt-Free, Unsalted, No Salt, No Salt Added, Without Added Salt.  Food that is usually processed with salt has been made without salt.  However, the food may contain sodium-containing additives, such as preservatives, leavening agents, or flavorings. Natural.  This term has no legal meaning. Organic.  Foods that are certified as organic have been inspected and approved by the USDA to ensure they are produced without pesticides, fertilizers containing synthetic ingredients, bioengineering, or ionizing radiation. Document Released: 07/14/2003 Document Revised: 10/03/2011 Document Reviewed: 01/29/2009 [employer] Patient Information 2014 [employer].  

## 2012-12-20 NOTE — Progress Notes (Signed)
Patient ID: Rachel York, female   DOB: 10/08/1950, 62 y.o.   MRN: [id number] Code Status: need to discuss living will, hcpoa  No Known Allergies  Chief Complaint  Patient presents with  . Medical Managment of Chronic Issues    HPI: Patient is a 62 y.o. female seen in the office today for medical mgt of chronic diseases.  Says she is ok.  Had abnormal mammogram and ultrasound.  They want to repeat ultrasound for right breast.  Had colonoscopy and now may need another.  Still has to pay quite a bit despite obama care.  Has numerous residual polyps remaining in her colon.  Wants her to get genetic testing to make sure she is not predisposed to polyps.  Has to reschedule her genetic testing.    Thought she lost weight, but she actually gained.  Is working out at gym.  Can wear clothes she could not wear before.  Has cut way back on meat, rarely eats red meat, not frying food.  Has increased veggie intake especially peas, beans, roughage.  Trying to avoid binging on sweets.    BP was elevated today, but good at Dr. Lauro Franklin office.    Knees are doing well.    Review of Systems:  Review of Systems  Constitutional: Negative for weight loss.  Eyes: Negative for blurred vision.  Respiratory: Negative for shortness of breath.   Cardiovascular: Negative for chest pain and leg swelling.  Gastrointestinal: Negative for heartburn and constipation.  Genitourinary: Negative for dysuria.  Musculoskeletal: Negative for joint pain and falls.  Skin: Negative for rash.  Neurological: Negative for dizziness and weakness.  Psychiatric/Behavioral: Negative for depression and memory loss.     Past Medical History  Diagnosis Date  . Hypertension   . Abdominal pain   . Arthritis   . GERD (gastroesophageal reflux disease)   . Allergy   . Cataract   . Hyperlipidemia     no meds  . Abdominal pain, epigastric   . Nontoxic uninodular goiter   . Tobacco use disorder   . Abdominal tenderness,  periumbilic   . Family history of diabetes mellitus   . Routine general medical examination at a health care facility   . Unspecified essential hypertension   . Pain in joint, site unspecified   . Lumbago    Past Surgical History  Procedure Laterality Date  . Cholecystectomy    . Abdominal hysterectomy    . Colonoscopy     Social History:   reports that she has been smoking Cigarettes.  She has been smoking about 0.50 packs per day. She has never used smokeless tobacco. She reports that she does not drink alcohol or use illicit drugs.  Family History  Problem Relation Age of Onset  . Colon cancer Neg Hx   . Esophageal cancer Neg Hx   . Rectal cancer Neg Hx   . Stomach cancer Neg Hx   . Cancer Mother     breast  . Diabetes Father   . Heart disease Father     Medications: Patient's Medications  New Prescriptions   No medications on file  Previous Medications   HYDROCHLOROTHIAZIDE (MICROZIDE) 12.5 MG CAPSULE    Take 6.25 mg by mouth daily.  Modified Medications   No medications on file  Discontinued Medications   OMEPRAZOLE (PRILOSEC) 20 MG CAPSULE    Take 20 mg by mouth daily.   TRAMADOL HCL 50 MG TBDP    Take by mouth. Take one tablets  Physical Exam:  Filed Vitals:   12/20/12 1349  BP: 140/78  Pulse: 88  Temp: 98.5 F (36.9 C)  TempSrc: Oral  Resp: 14  Height: 5' 5.5" (1.664 m)  Weight: 222 lb (100.699 kg)   Physical Exam  Constitutional: She is oriented to person, place, and time. No distress.  Obese AA female  HENT:  Head: Normocephalic and atraumatic.  Cardiovascular: Normal rate, regular rhythm, normal heart sounds and intact distal pulses.   Pulmonary/Chest: Effort normal and breath sounds normal.  Abdominal: Soft. Bowel sounds are normal.  Musculoskeletal: Normal range of motion. She exhibits no edema and no tenderness.  Neurological: She is alert and oriented to person, place, and time.  Skin: Skin is warm and dry.  Psychiatric: She has a  normal mood and affect.      Labs reviewed: Basic Metabolic Panel:  Recent Labs  16/10/96 2036 12/20/12 1646  NA 139 141  K 3.7 3.9  CL 101 101  CO2 29 28  GLUCOSE 123* 85  BUN 15 15  CREATININE 0.75 0.79  CALCIUM 9.6 9.3   Liver Function Tests:  Recent Labs  09/18/12 2036  AST 13  ALT 9  ALKPHOS 100  BILITOT 0.4  PROT 8.0  ALBUMIN 3.6    Recent Labs  09/18/12 2036  LIPASE 30   CBC:  Recent Labs  09/18/12 2036 12/20/12 1646  WBC 7.4 7.3  NEUTROABS 2.4 2.9  HGB 15.7* 14.3  HCT 43.8 42.8  MCV 85.7 89  PLT 324  --     Past Procedures: Reviewed colonoscopy, mammogram, breast ultrasound  Assessment/Plan GERD (gastroesophageal reflux disease) Improved.    Osteoarthritis Doing better.  [facility].    Tobacco use disorder Counseling continued today.    Essential hypertension, benign Cont hctz.  Has improved.  Was higher here but better at her previous viewable dr s appts.    Hyperglycemia Is exercising and watching her diet.  Given more diet information today.  Encouraged.  F/u her hba1c today  Hx of adenomatous colonic polyps Has to return to GI after genetic testing.  She canceled this but is going to reschedule it.  Is very concerned about the costs.   Labs/tests ordered:  Bmp, cbc, hba1c F/u 4 mos.

## 2012-12-21 LAB — CBC WITH DIFFERENTIAL/PLATELET
Basophils Absolute: 0.1 10*3/uL (ref 0.0–0.2)
Basos: 1 % (ref 0–3)
Eos: 2 % (ref 0–5)
Eosinophils Absolute: 0.2 10*3/uL (ref 0.0–0.4)
HCT: 42.8 % (ref 34.0–46.6)
Hemoglobin: 14.3 g/dL (ref 11.1–15.9)
Immature Grans (Abs): 0 10*3/uL (ref 0.0–0.1)
Immature Granulocytes: 0 % (ref 0–2)
Lymphocytes Absolute: 3.7 10*3/uL — ABNORMAL HIGH (ref 0.7–3.1)
Lymphs: 50 % — ABNORMAL HIGH (ref 14–46)
MCH: 29.9 pg (ref 26.6–33.0)
MCHC: 33.4 g/dL (ref 31.5–35.7)
MCV: 89 fL (ref 79–97)
Monocytes Absolute: 0.5 10*3/uL (ref 0.1–0.9)
Monocytes: 7 % (ref 4–12)
Neutrophils Absolute: 2.9 10*3/uL (ref 1.4–7.0)
Neutrophils Relative %: 40 % (ref 40–74)
RBC: 4.79 x10E6/uL (ref 3.77–5.28)
RDW: 14.6 % (ref 12.3–15.4)
WBC: 7.3 10*3/uL (ref 3.4–10.8)

## 2012-12-21 LAB — BASIC METABOLIC PANEL
BUN/Creatinine Ratio: 19 (ref 11–26)
BUN: 15 mg/dL (ref 8–27)
CO2: 28 mmol/L (ref 19–28)
Calcium: 9.3 mg/dL (ref 8.6–10.2)
Chloride: 101 mmol/L (ref 97–108)
Creatinine, Ser: 0.79 mg/dL (ref 0.57–1.00)
GFR calc Af Amer: 92 mL/min/{1.73_m2} (ref >59)
GFR calc non Af Amer: 80 mL/min/{1.73_m2} (ref >59)
Glucose: 85 mg/dL (ref 65–99)
Potassium: 3.9 mmol/L (ref 3.5–5.2)
Sodium: 141 mmol/L (ref 134–144)

## 2012-12-21 LAB — HEMOGLOBIN A1C
Est. average glucose Bld gHb Est-mCnc: 126 mg/dL
Hgb A1c MFr Bld: 6 % — ABNORMAL HIGH (ref 4.8–5.6)

## 2012-12-22 NOTE — Assessment & Plan Note (Signed)
Has to return to GI after genetic testing.  She canceled this but is going to reschedule it.  Is very concerned about the costs.

## 2012-12-22 NOTE — Assessment & Plan Note (Signed)
Doing better.  [facility] 3 hours per week.

## 2012-12-22 NOTE — Assessment & Plan Note (Signed)
Counseling continued today.

## 2012-12-22 NOTE — Assessment & Plan Note (Signed)
Is exercising and watching her diet.  Given more diet information today.  Encouraged.  F/u her hba1c today

## 2012-12-22 NOTE — Assessment & Plan Note (Signed)
Improved

## 2012-12-22 NOTE — Assessment & Plan Note (Signed)
Cont hctz.  Has improved.  Was higher here but better at her previous viewable dr s appts.

## 2013-01-18 ENCOUNTER — Inpatient Hospital Stay: Admission: RE | Admit: 2013-01-18 | Payer: BC Managed Care – PPO | Source: Ambulatory Visit

## 2013-02-08 ENCOUNTER — Encounter: Payer: Self-pay | Admitting: Internal Medicine

## 2013-04-22 ENCOUNTER — Ambulatory Visit: Payer: BC Managed Care – PPO | Admitting: Internal Medicine

## 2013-04-29 ENCOUNTER — Ambulatory Visit (INDEPENDENT_AMBULATORY_CARE_PROVIDER_SITE_OTHER): Payer: BC Managed Care – PPO | Admitting: Internal Medicine

## 2013-04-29 ENCOUNTER — Encounter: Payer: Self-pay | Admitting: Internal Medicine

## 2013-04-29 VITALS — BP 150/92 | HR 56 | Temp 98.1°F | Wt 218.0 lb

## 2013-04-29 DIAGNOSIS — K219 Gastro-esophageal reflux disease without esophagitis: Secondary | ICD-10-CM

## 2013-04-29 DIAGNOSIS — M1990 Unspecified osteoarthritis, unspecified site: Secondary | ICD-10-CM

## 2013-04-29 DIAGNOSIS — Z23 Encounter for immunization: Secondary | ICD-10-CM

## 2013-04-29 DIAGNOSIS — Z860101 Personal history of adenomatous and serrated colon polyps: Secondary | ICD-10-CM

## 2013-04-29 DIAGNOSIS — R739 Hyperglycemia, unspecified: Secondary | ICD-10-CM

## 2013-04-29 DIAGNOSIS — I10 Essential (primary) hypertension: Secondary | ICD-10-CM

## 2013-04-29 DIAGNOSIS — R1013 Epigastric pain: Secondary | ICD-10-CM

## 2013-04-29 DIAGNOSIS — R7309 Other abnormal glucose: Secondary | ICD-10-CM

## 2013-04-29 DIAGNOSIS — Z86010 Personal history of colonic polyps: Secondary | ICD-10-CM

## 2013-04-29 DIAGNOSIS — R42 Dizziness and giddiness: Secondary | ICD-10-CM

## 2013-04-29 DIAGNOSIS — F17200 Nicotine dependence, unspecified, uncomplicated: Secondary | ICD-10-CM

## 2013-04-29 NOTE — Progress Notes (Signed)
Patient ID: Midge Momon, female   DOB: [birth year], 62 y.o.   MRN: [id number] Location:  [facility] / [facility]  No Known Allergies  Chief Complaint  Patient presents with  . Medical Managment of Chronic Issues    4 month follow-up   . Knee Problem    knee stiffness and pain off/on, ongoing concern   . Dizziness    x several months   . GI Problem    ongoing stomach issues     HPI: Patient is a 62 y.o. black female seen in the office today for f/u of chronic medical conditions.  She has ongoing difficulty with osteoarthritis of her knees, dizziness and stomach problems.  Lost 4 lbs since last visit.  Did not go to gym in summer, but may return today.    Got flu shot today.    Knees did well with cortisone shot for 3 going on 4 mos.  Left one now radiating down anterior leg just recently again.    Dizziness--gets very lightheaded when standing or walking around.  Sometimes will have a spinning sensation.  Gets awfully tired periodically.  No palpitations/irregularity of heart noted.  Is sometimes short of breath.  No chest pains. Shortness of breath is sometimes with the lightheadedness.    Is still smoking.  Still smoking same amount also.    Still having abdominal pain.  Has had several episodes.  Gets nauseated, doubled over, lasts 2-4 days, then sore muscles in stomach for a few days afterward.  No fever, chills.  Sometimes heating pad will help, sometimes tylenol helps and puts her to sleep.  Tried mylanta.    Second cscope recommended but insurance won't cover.  Is to have repeat mammo and insurance won't cover.    Review of Systems:  Review of Systems  Constitutional: Positive for weight loss. Negative for fever, chills and malaise/fatigue.  HENT: Negative for congestion.   Eyes: Negative for blurred vision.  Respiratory: Positive for shortness of breath. Negative for cough.   Cardiovascular: Negative for chest pain and palpitations.   Gastrointestinal: Positive for nausea and abdominal pain.  Genitourinary: Negative for dysuria.  Musculoskeletal: Positive for myalgias and joint pain.  Neurological: Positive for dizziness. Negative for headaches.  Psychiatric/Behavioral: Negative for depression.    Past Medical History  Diagnosis Date  . Hypertension   . Abdominal pain   . Arthritis   . GERD (gastroesophageal reflux disease)   . Allergy   . Cataract   . Hyperlipidemia     no meds  . Abdominal pain, epigastric   . Nontoxic uninodular goiter   . Tobacco use disorder   . Abdominal tenderness, periumbilic   . Family history of diabetes mellitus   . Routine general medical examination at a health care facility   . Unspecified essential hypertension   . Pain in joint, site unspecified   . Lumbago     Past Surgical History  Procedure Laterality Date  . Cholecystectomy    . Abdominal hysterectomy    . Colonoscopy      Social History:   reports that she has been smoking Cigarettes.  She has been smoking about 0.50 packs per day. She has never used smokeless tobacco. She reports that she does not drink alcohol or use illicit drugs.  Family History  Problem Relation Age of Onset  . Colon cancer Neg Hx   . Esophageal cancer Neg Hx   . Rectal cancer Neg Hx   .  Stomach cancer Neg Hx   . Cancer Mother     breast  . Diabetes Father   . Heart disease Father     Medications: Patient's Medications  New Prescriptions   No medications on file  Previous Medications   HYDROCHLOROTHIAZIDE (MICROZIDE) 12.5 MG CAPSULE    Take 12.5 mg by mouth daily.   Modified Medications   No medications on file  Discontinued Medications   No medications on file     Physical Exam: Filed Vitals:   04/29/13 1110  BP: 122/74  Pulse: 58  Temp: 98.1 F (36.7 C)  TempSrc: Oral  Weight: 218 lb (98.884 kg)  SpO2: 98%  Physical Exam  Constitutional: She is oriented to person, place, and time. She appears well-developed and  well-nourished. No distress.  HENT:  Head: Normocephalic and atraumatic.  Eyes: EOM are normal. Pupils are equal, round, and reactive to light.  Neck: Neck supple. No thyromegaly present.  Cardiovascular: Normal rate, regular rhythm, normal heart sounds and intact distal pulses.   Pulmonary/Chest: Effort normal and breath sounds normal. No respiratory distress.  Abdominal: Soft. Bowel sounds are normal. She exhibits no distension. There is no tenderness.  Musculoskeletal: Normal range of motion. She exhibits tenderness.  Of knees, left greater than right  Neurological: She is alert and oriented to person, place, and time. No cranial nerve deficit.  Skin: Skin is warm and dry.  Consent obtained.  Area marked.  Left knee cleansed with betadine x 2, then sprayed with topical anesthetic, injected with 1ml 1% lidocaine with 40mg /ml 1ml kenalog. bandaid applied.  Immediate relief noted.    Labs reviewed: Basic Metabolic Panel:  Recent Labs  09/81/19 2036 12/20/12 1646  NA 139 141  K 3.7 3.9  CL 101 101  CO2 29 28  GLUCOSE 123* 85  BUN 15 15  CREATININE 0.75 0.79  CALCIUM 9.6 9.3   Liver Function Tests:  Recent Labs  09/18/12 2036  AST 13  ALT 9  ALKPHOS 100  BILITOT 0.4  PROT 8.0  ALBUMIN 3.6    Recent Labs  09/18/12 2036  LIPASE 30  CBC:  Recent Labs  09/18/12 2036 12/20/12 1646  WBC 7.4 7.3  NEUTROABS 2.4 2.9  HGB 15.7* 14.3  HCT 43.8 42.8  MCV 85.7 89  PLT 324  --    Lab Results  Component Value Date   HGBA1C 6.0* 12/20/2012   Past Procedures: mammo--f/u diagnostic has been recommended but insurance will not cover and cannot afford right now cscope--f/u recommended, but again not covered by insurance and pt cannot afford  Assessment/Plan 1. Need for prophylactic vaccination and inoculation against influenza -given flu shot  2. Hyperglycemia - Hemoglobin A1c - has been working on diet and exercise, but limited some by her knee feeling worse   3.  Abdominal pain, epigastric - H. pylori Antibody, IgG - CBC with Differential - Comprehensive metabolic panel  4. Hx of adenomatous colonic polyps -needs f/u cscope but cannot afford at this time  5. Tobacco use disorder - is not ready to quit smoking  6. Osteoarthritis - PR DRAIN/INJECT LARGE JOINT/BURSA left knee as above  7. Essential hypertension, benign -at goal with low dose hctz, is dizzy at times and questions whether this is due to her bp being too low, but orthostatics were normal today  8. GERD (gastroesophageal reflux disease) - H. pylori Antibody, IgG due to ongoing epigastric pain  9. Dizziness and giddiness - Orthostatic vital signs were unremarkable at present,  but also asymptomatic at this time -if persists, she will let us know  Labs/tests ordered:  Hba1c, cbc, cmp, h pylori ab Next appt:  4 mos

## 2013-05-01 LAB — CBC WITH DIFFERENTIAL/PLATELET
Basophils Absolute: 0.1 10*3/uL (ref 0.0–0.2)
Basos: 1 %
Eos: 3 %
Eosinophils Absolute: 0.2 10*3/uL (ref 0.0–0.4)
HCT: 43 % (ref 34.0–46.6)
Hemoglobin: 14.5 g/dL (ref 11.1–15.9)
Immature Grans (Abs): 0 10*3/uL (ref 0.0–0.1)
Immature Granulocytes: 0 %
Lymphocytes Absolute: 3.2 10*3/uL — ABNORMAL HIGH (ref 0.7–3.1)
Lymphs: 50 %
MCH: 29.3 pg (ref 26.6–33.0)
MCHC: 33.7 g/dL (ref 31.5–35.7)
MCV: 87 fL (ref 79–97)
Monocytes Absolute: 0.4 10*3/uL (ref 0.1–0.9)
Monocytes: 6 %
Neutrophils Absolute: 2.6 10*3/uL (ref 1.4–7.0)
Neutrophils Relative %: 40 %
RBC: 4.95 x10E6/uL (ref 3.77–5.28)
RDW: 13.9 % (ref 12.3–15.4)
WBC: 6.4 10*3/uL (ref 3.4–10.8)

## 2013-05-01 LAB — H. PYLORI ANTIBODY, IGG: H Pylori IgG: <0.9 U/mL (ref 0.0–0.8)

## 2013-05-01 LAB — COMPREHENSIVE METABOLIC PANEL
ALT: 7 IU/L (ref 0–32)
AST: 12 IU/L (ref 0–40)
Albumin/Globulin Ratio: 1.4 (ref 1.1–2.5)
Albumin: 4 g/dL (ref 3.6–4.8)
Alkaline Phosphatase: 99 IU/L (ref 39–117)
BUN/Creatinine Ratio: 19 (ref 11–26)
BUN: 13 mg/dL (ref 8–27)
CO2: 27 mmol/L (ref 18–29)
Calcium: 10.2 mg/dL (ref 8.6–10.2)
Chloride: 102 mmol/L (ref 97–108)
Creatinine, Ser: 0.69 mg/dL (ref 0.57–1.00)
GFR calc Af Amer: 107 mL/min/{1.73_m2} (ref >59)
GFR calc non Af Amer: 93 mL/min/{1.73_m2} (ref >59)
Globulin, Total: 2.8 g/dL (ref 1.5–4.5)
Glucose: 102 mg/dL — ABNORMAL HIGH (ref 65–99)
Potassium: 5.3 mmol/L — ABNORMAL HIGH (ref 3.5–5.2)
Sodium: 144 mmol/L (ref 134–144)
Total Bilirubin: 0.5 mg/dL (ref 0.0–1.2)
Total Protein: 6.8 g/dL (ref 6.0–8.5)

## 2013-05-01 LAB — HEMOGLOBIN A1C
Est. average glucose Bld gHb Est-mCnc: 137 mg/dL
Hgb A1c MFr Bld: 6.4 % — ABNORMAL HIGH (ref 4.8–5.6)

## 2013-07-15 ENCOUNTER — Ambulatory Visit: Payer: BC Managed Care – PPO | Admitting: Internal Medicine

## 2013-08-01 ENCOUNTER — Ambulatory Visit: Payer: BC Managed Care – PPO | Admitting: Internal Medicine

## 2013-08-13 IMAGING — MG MM DIGITAL DIAGNOSTIC LIMITED BILAT
4 series · 4 of 4 positions shown · non-contrast
Comparison: None.

CLINICAL DATA: Abnormal screening, bilaterally.  Family history of
breast cancer (mother).

DIGITAL DIAGNOSTIC BILATERAL MAMMOGRAM WITHOUT CAD AND BILATERAL
BREAST ULTRASOUND:

[L CC]
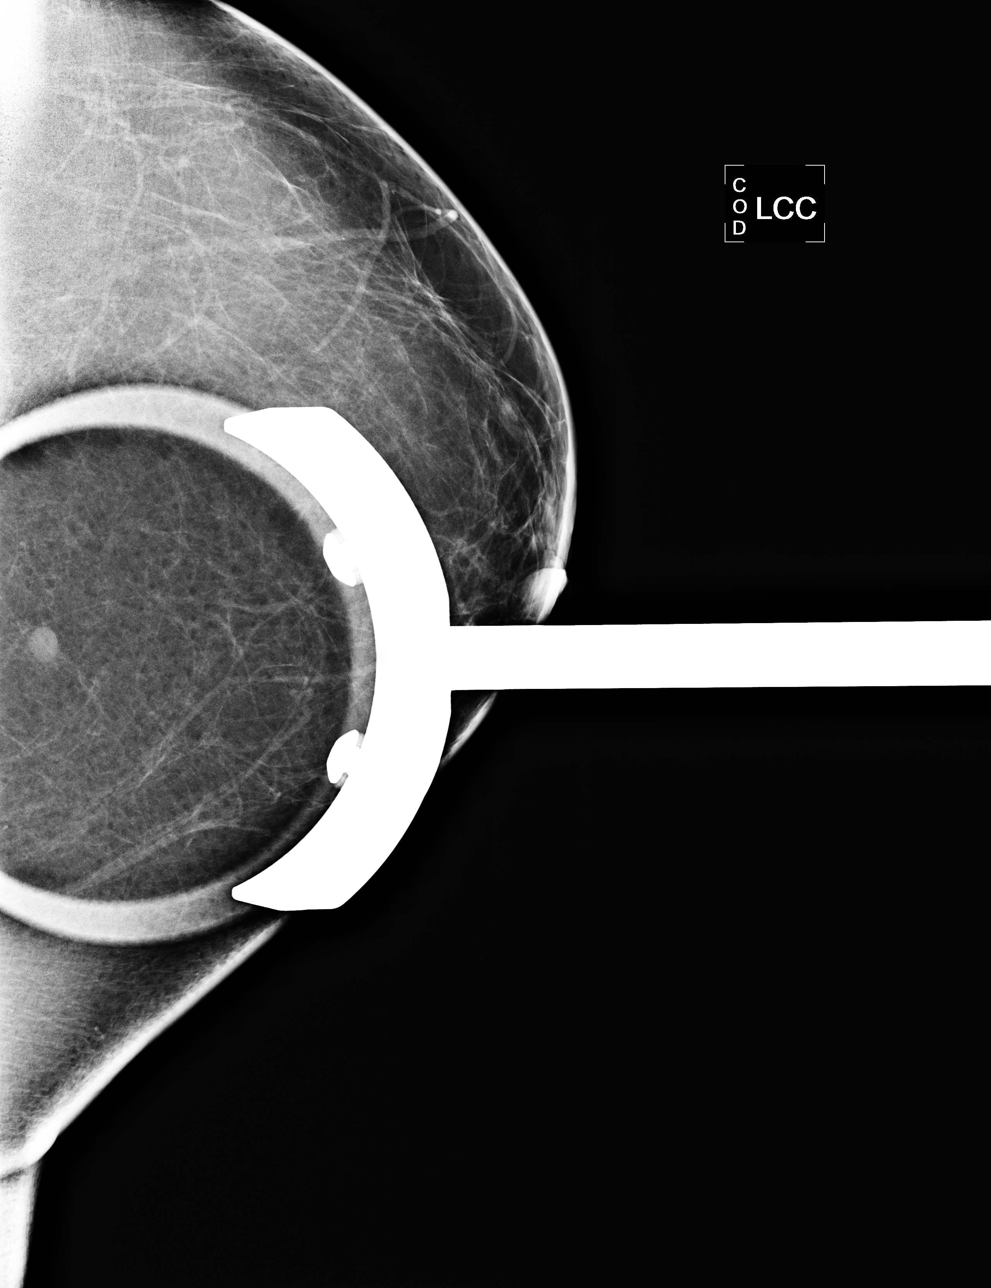

[L ML]
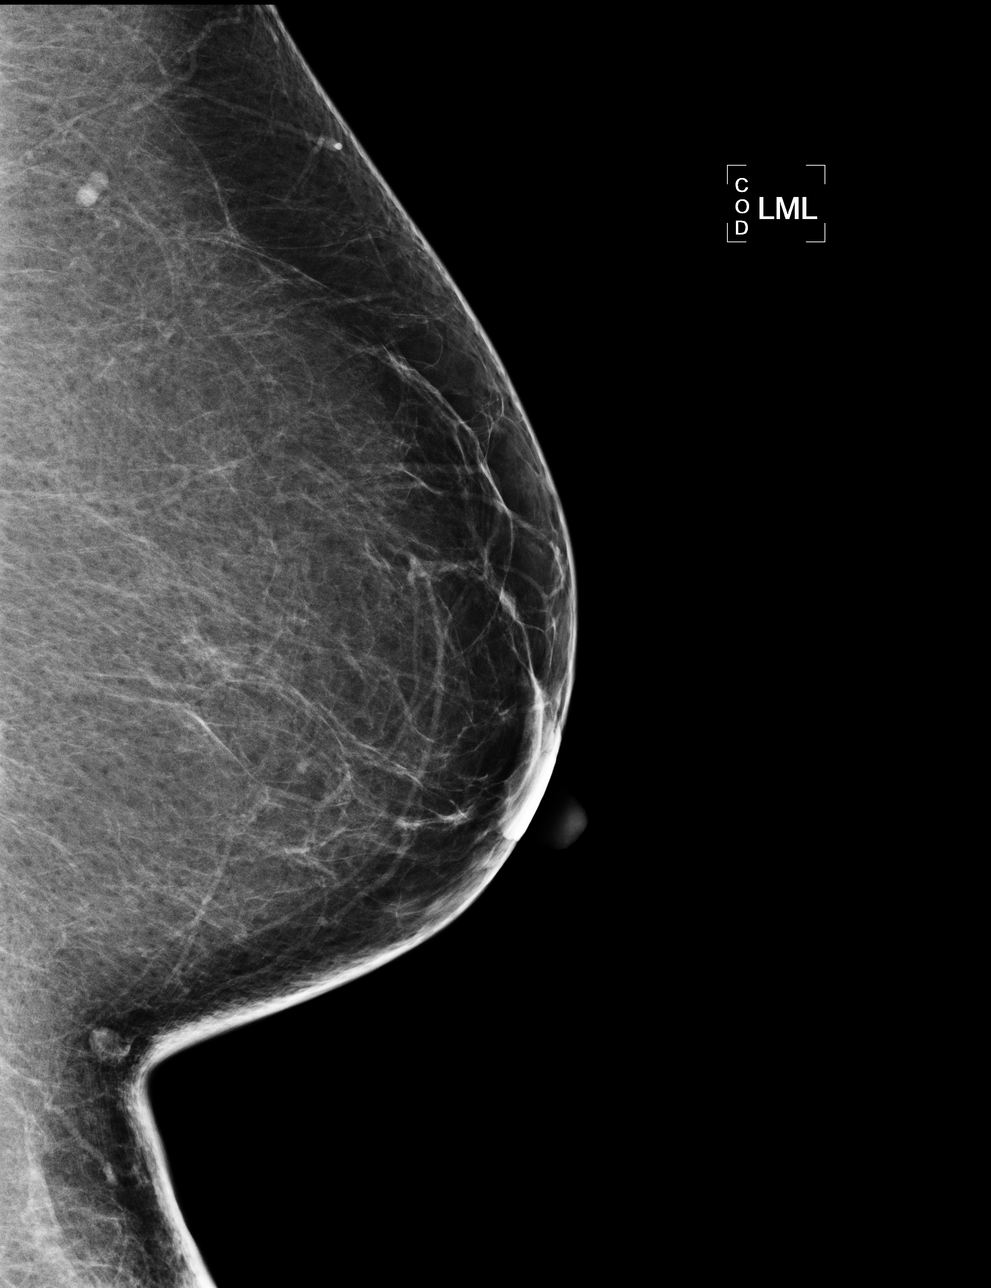

[R MLO]
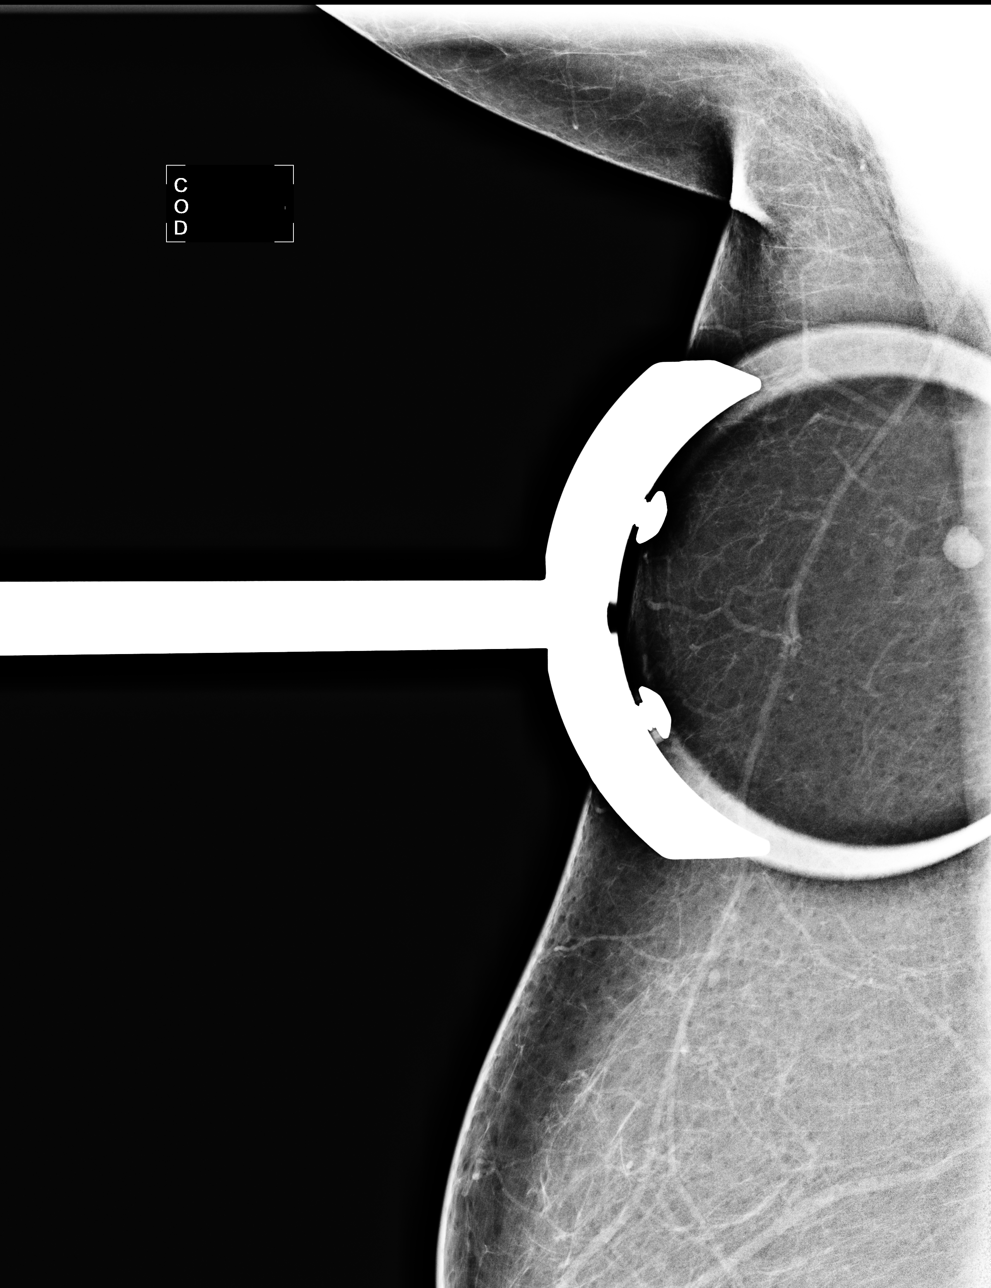

[R XCCL]
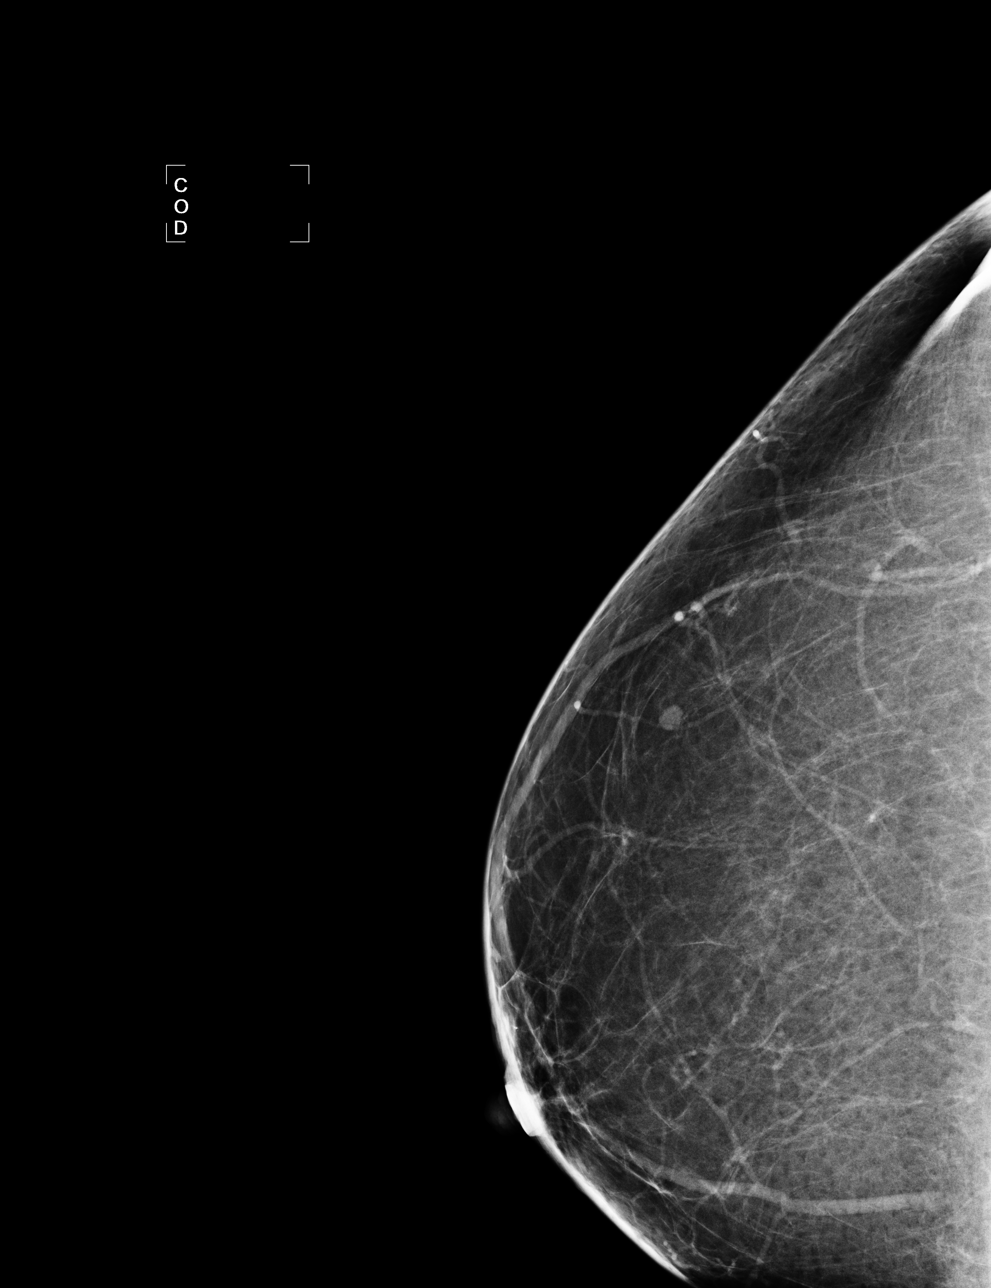

[4 of 4 positions shown; findings below may reference images not displayed]

FINDINGS: ACR Breast Density Category 1: The breast tissue is almost entirely
fatty.

85degree lateral and spot compression views in the left breast,
posteriorly confirm the presence of a round isodense mass with
circumscribed margins in the lower inner quadrant. Spot compression
views in the area of concern in the right axilla confirm the
presence of a round isodense masses circumscribed margins and
likely, central fat.  This area is not seen on the X C C L view.

On physical exam of the left breast, I palpate normal tissue in the
lower inner quadrant.  Sonographically, an oval hypoechoic mass at
the junction of the skin and breast is seen in the 8 o'clock
position, 6 cm the nipple with a tract leading to the skin
compatible with a sebaceous or epidermal inclusion cyst.

An oval hypoechoic mass with circumscribed margins is imaged in the
9 o'clock position, 4 cm the nipple measuring 0.4 x 0.2 x 0.3 cm.
A lymph node with diffusely thickened cortex is imaged in the right
axilla measuring 1.0 x 0.5 x 1.3 cm.  Adjacent to this, another
node with a diffusely thickened cortex is imaged measuring 1.0 x
0.7 x 0.7 cm.

The patient states she had a flu shot in the right arm 1 month
prior.
IMPRESSION: Probably benign findings, right breast.  A follow-up right breast
and axillary ultrasound is recommended in 3 months.  Benign
findings, left breast.

RECOMMENDATION:
Right breast and axillary ultrasound in 3 months.

I have discussed the findings and recommendations with the patient.
Results were also provided in writing at the conclusion of the
visit.  If applicable, a reminder letter will be sent to the
patient regarding her next appointment.

BI-RADS CATEGORY 3:  Probably benign finding(s) - short interval
follow-up suggested.

## 2013-08-13 IMAGING — US US BREAST BILAT
1 series · 13 of 20 positions shown · non-contrast
Comparison: None.

CLINICAL DATA: Abnormal screening, bilaterally.  Family history of
breast cancer (mother).

DIGITAL DIAGNOSTIC BILATERAL MAMMOGRAM WITHOUT CAD AND BILATERAL
BREAST ULTRASOUND:

[Series 1: us breast bilat · 13 of 20 slices shown]
[im 1/20]
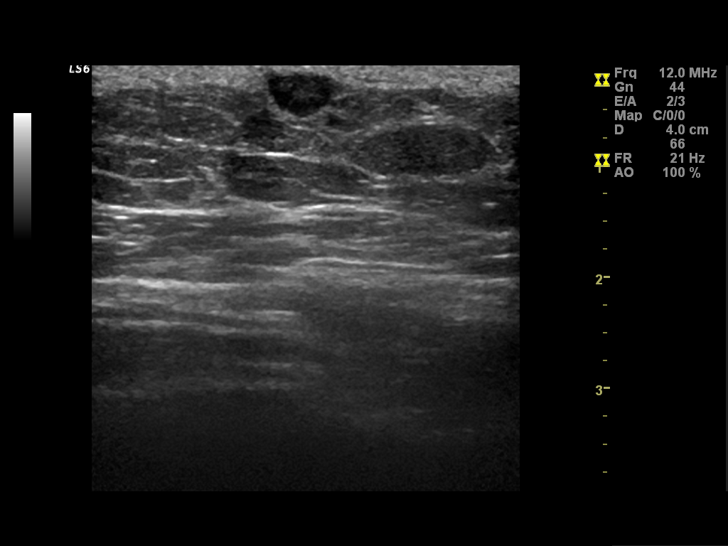
[im 3/20]
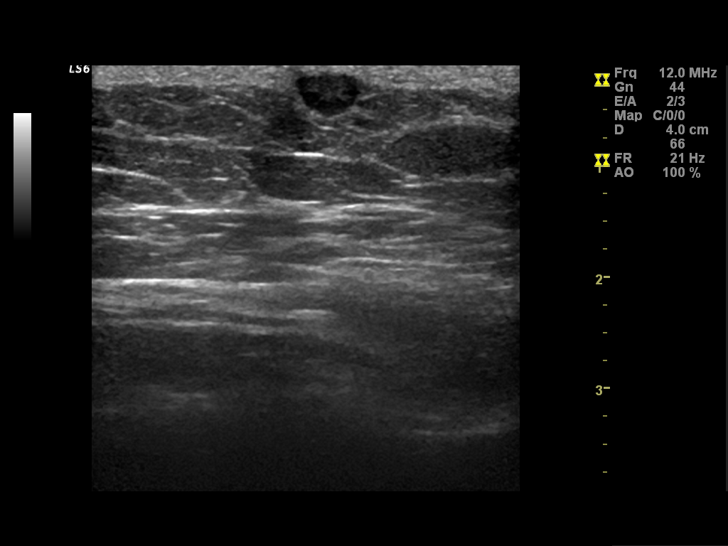
[im 4/20]
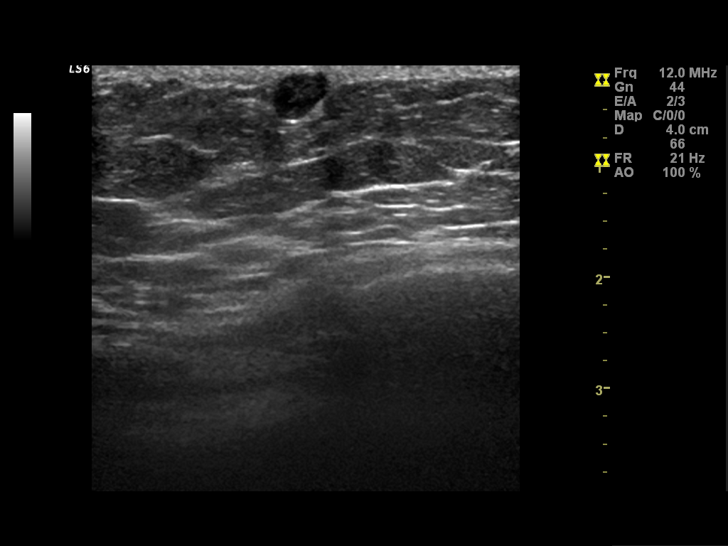
[im 6/20]
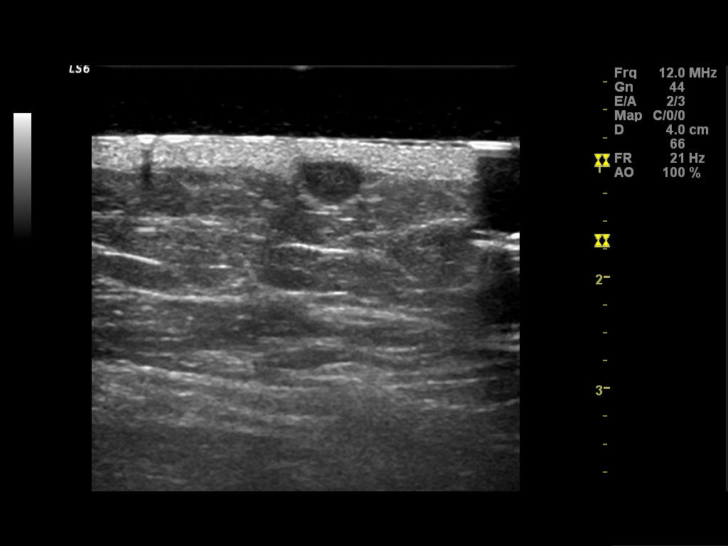
[im 7/20]
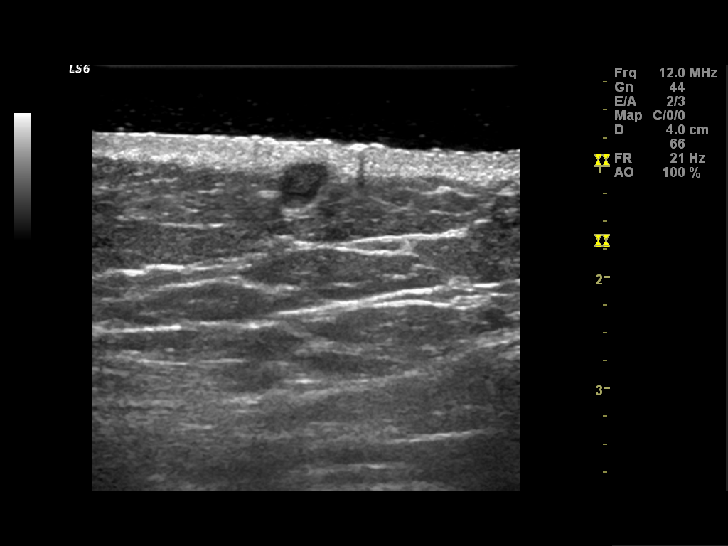
[im 9/20]
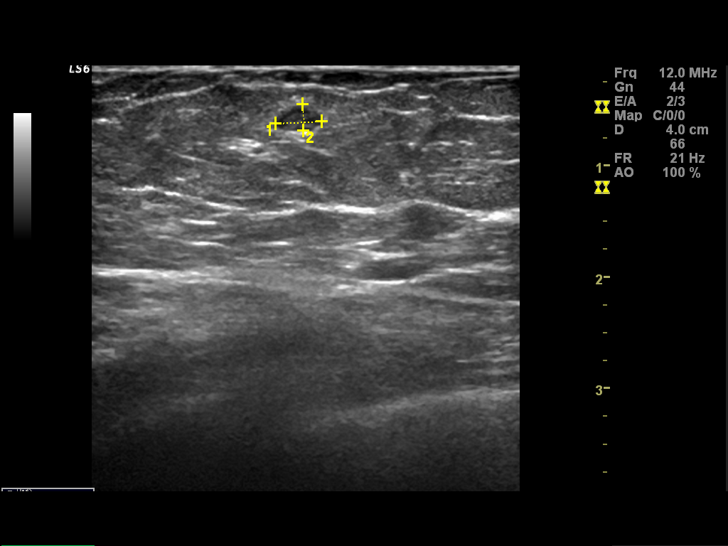
[im 11/20]
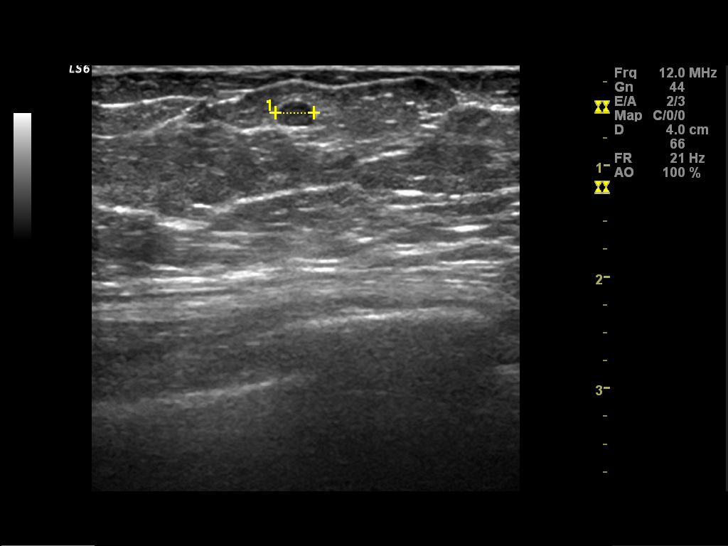
[im 12/20]
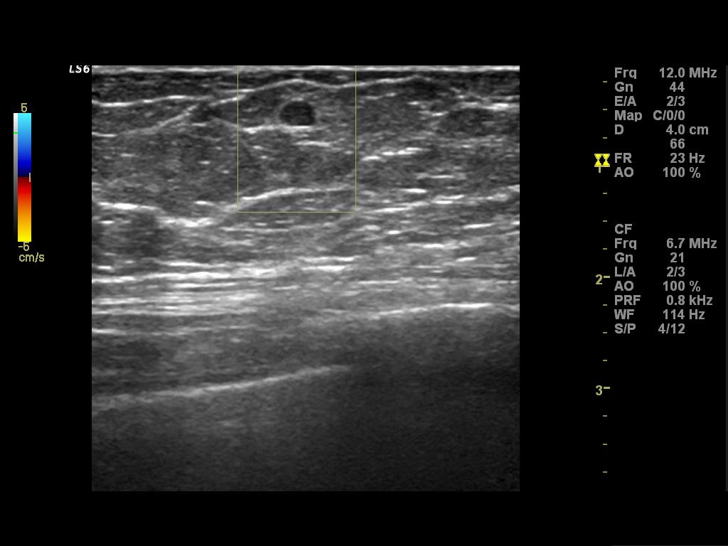
[im 14/20]
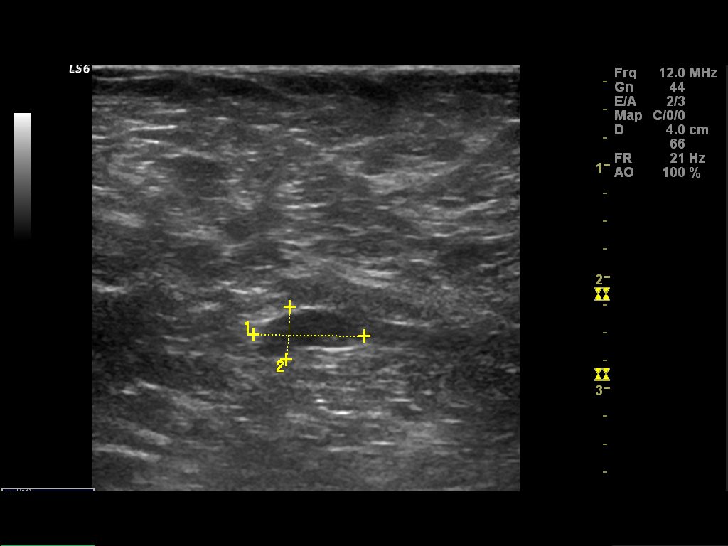
[im 15/20]
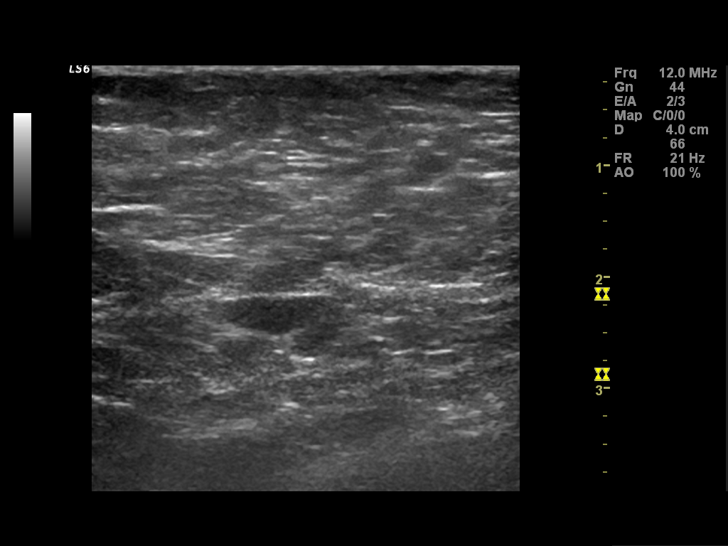
[im 17/20]
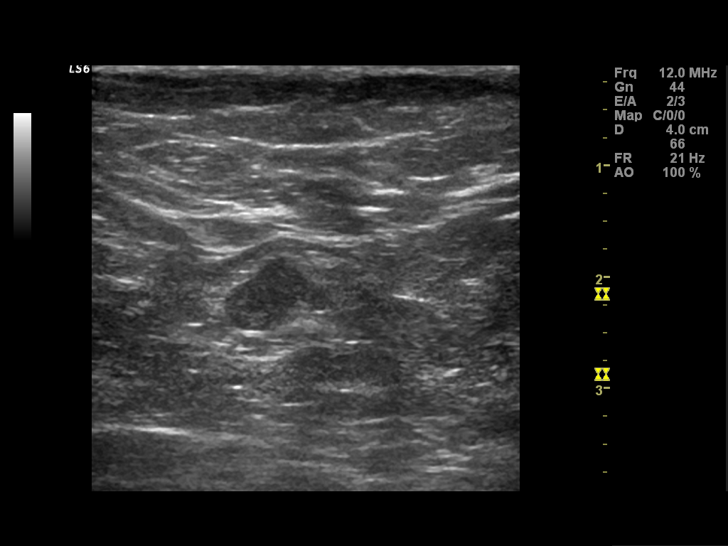
[im 18/20]
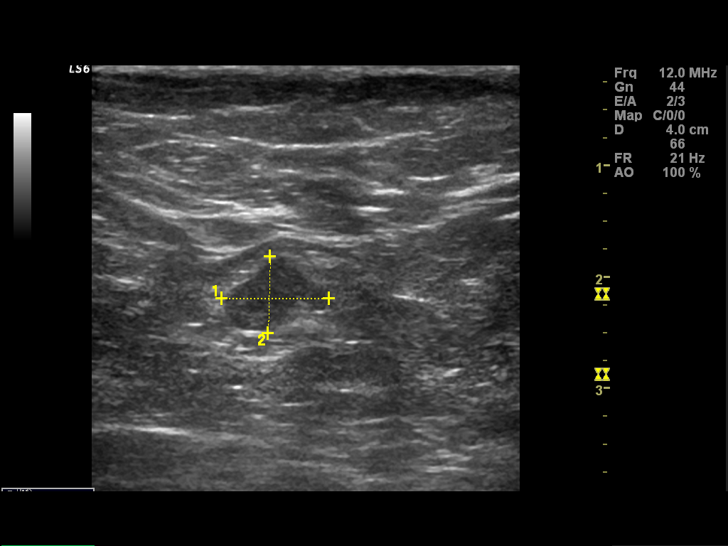
[im 20/20]
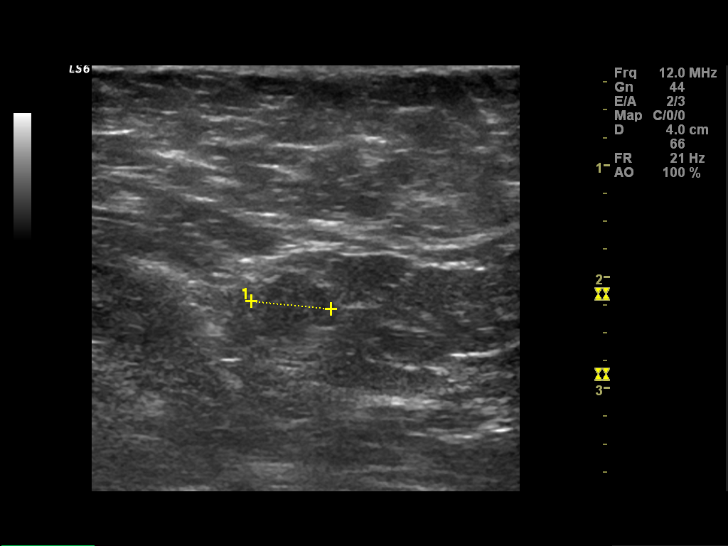

[13 of 20 positions shown; findings below may reference images not displayed]

FINDINGS: ACR Breast Density Category 1: The breast tissue is almost entirely
fatty.

85degree lateral and spot compression views in the left breast,
posteriorly confirm the presence of a round isodense mass with
circumscribed margins in the lower inner quadrant. Spot compression
views in the area of concern in the right axilla confirm the
presence of a round isodense masses circumscribed margins and
likely, central fat.  This area is not seen on the X C C L view.

On physical exam of the left breast, I palpate normal tissue in the
lower inner quadrant.  Sonographically, an oval hypoechoic mass at
the junction of the skin and breast is seen in the 8 o'clock
position, 6 cm the nipple with a tract leading to the skin
compatible with a sebaceous or epidermal inclusion cyst.

An oval hypoechoic mass with circumscribed margins is imaged in the
9 o'clock position, 4 cm the nipple measuring 0.4 x 0.2 x 0.3 cm.
A lymph node with diffusely thickened cortex is imaged in the right
axilla measuring 1.0 x 0.5 x 1.3 cm.  Adjacent to this, another
node with a diffusely thickened cortex is imaged measuring 1.0 x
0.7 x 0.7 cm.

The patient states she had a flu shot in the right arm 1 month
prior.
IMPRESSION: Probably benign findings, right breast.  A follow-up right breast
and axillary ultrasound is recommended in 3 months.  Benign
findings, left breast.

RECOMMENDATION:
Right breast and axillary ultrasound in 3 months.

I have discussed the findings and recommendations with the patient.
Results were also provided in writing at the conclusion of the
visit.  If applicable, a reminder letter will be sent to the
patient regarding her next appointment.

BI-RADS CATEGORY 3:  Probably benign finding(s) - short interval
follow-up suggested.

## 2013-09-05 ENCOUNTER — Ambulatory Visit: Payer: BC Managed Care – PPO | Admitting: Internal Medicine

## 2013-10-18 ENCOUNTER — Encounter: Payer: Self-pay | Admitting: Internal Medicine

## 2013-12-10 ENCOUNTER — Ambulatory Visit (INDEPENDENT_AMBULATORY_CARE_PROVIDER_SITE_OTHER): Payer: 59 | Admitting: Internal Medicine

## 2013-12-10 ENCOUNTER — Encounter: Payer: Self-pay | Admitting: Internal Medicine

## 2013-12-10 VITALS — BP 124/76 | HR 63 | Temp 97.8°F | Wt 229.4 lb

## 2013-12-10 DIAGNOSIS — H579 Unspecified disorder of eye and adnexa: Secondary | ICD-10-CM

## 2013-12-10 DIAGNOSIS — R6889 Other general symptoms and signs: Secondary | ICD-10-CM | POA: Insufficient documentation

## 2013-12-10 DIAGNOSIS — R21 Rash and other nonspecific skin eruption: Secondary | ICD-10-CM

## 2013-12-10 MED ORDER — TETRAHYDROZOLINE HCL 0.05 % OP SOLN: 1.0000 [drp] | Freq: Two times a day (BID) | OPHTHALMIC | Status: DC

## 2013-12-10 MED ORDER — HYDROXYZINE HCL 10 MG PO TABS: 10.0000 mg | ORAL_TABLET | Freq: Three times a day (TID) | ORAL | Status: DC | PRN

## 2013-12-10 MED ORDER — PREDNISONE 20 MG PO TABS: ORAL_TABLET | ORAL | Status: DC

## 2013-12-10 MED ORDER — CETIRIZINE HCL 10 MG PO TABS: 10.0000 mg | ORAL_TABLET | Freq: Every day | ORAL | Status: DC

## 2013-12-10 NOTE — Patient Instructions (Signed)
Use calamine clear for the skin to help provide soothing effect

## 2013-12-10 NOTE — Progress Notes (Signed)
Patient ID: Rachel York, female   DOB: 1950/09/08, 63 y.o.   MRN: [id number]    Chief Complaint  Patient presents with  . Rash    frearm and front of legs  . Allergies    Eyes itchy, watery   No Known Allergies  HPI 63 y/o female patient is here for acute visit. She has been having itching that started on her forearm 2-3 weeks back and now is in her legs. It is intermittently itching, interurpts her sleep and sometimes has burning sensation and discomfort in rash area. Denies any pus or drainage in rash area. She has also been having watery eyes with itching. She has cough in the morning with clear phlegm. Denies itching in her ears Has occassional itching of her nose but no nasal discharge No difficulty swallowing Denies itching in throat No new product used in skin No new clothes or medications Also has had 2 new pets recently  ROS No fever or chills No change of vision No headache or abdominal pain No itching elsewhere besides ones in HPI  Past Medical History  Diagnosis Date  . Hypertension   . Abdominal pain   . Arthritis   . GERD (gastroesophageal reflux disease)   . Allergy   . Cataract   . Hyperlipidemia     no meds  . Abdominal pain, epigastric   . Nontoxic uninodular goiter   . Tobacco use disorder   . Abdominal tenderness, periumbilic   . Family history of diabetes mellitus   . Routine general medical examination at a health care facility   . Unspecified essential hypertension   . Pain in joint, site unspecified   . Lumbago    No Known Allergies  Physical exam BP 124/76  Pulse 63  Temp(Src) 97.8 F (36.6 C) (Oral)  Wt 229 lb 6.4 oz (104.055 kg)  SpO2 99%  General- elderly female in no acute distress Head- atraumatic, normocephalic Eyes- PERRLA, EOMI, no pallor, no icterus, no discharge Ears- left ear normal tympanic membrane and normal external ear canal , right ear normal tympanic membrane and normal external ear canal Neck- no  lymphadenopathy, no thyromegaly Nose- red and swollen nasal mucosa, no maxillary sinus tenderness, no frontal sinus tenderness Mouth- normal mucus membrane, no oral thrush, normal oropharynx Cardiovascular- normal s1,s2, no murmurs Respiratory- bilateral clear to auscultation, no wheeze, no rhonchi, no crackles Abdomen- bowel sounds present, soft, non tender Musculoskeletal- able to move all 4 extremities Skin- warm and dry, has erythematous papules in both forearm and in both anterior leg, no vesicles noted, no pustules, no crusted lesion Psychiatry- alert and oriented to person, place and time, normal mood and affect  Assessment/plan  1. Generalized papular rash Likely from seasonal allergies as patient mentions having similar rash last year around this time. Patient has tried OTC hydrocortisone cream and benadryl and claritin. Will have her on prednisone 20 mg daily for 5 days with zyrtec 10 mg daily and reassess. advsied not to take any OTC product beside the prescribed med. Will provide hydroxyzine to help with itching. Also advised to use calamine lotion in arm and leg to help soothe the burning sensation - cetirizine (ZYRTEC) 10 MG tablet; Take 1 tablet (10 mg total) by mouth daily. For 2 weeks and then once a day as needed for allergies  Dispense: 30 tablet; Refill: 1 - predniSONE (DELTASONE) 20 MG tablet; Take one tablet daily for 5 days  Dispense: 5 tablet; Refill: 0 - hydrOXYzine (ATARAX/VISTARIL) 10 MG tablet;  Take 1 tablet (10 mg total) by mouth 3 (three) times daily as needed.  Dispense: 30 tablet; Refill: 0  2. Itchy eyes Will have her on vysine eye drop bid for a week and then prn to help with allergy related histamine release and itching - cetirizine (ZYRTEC) 10 MG tablet; Take 1 tablet (10 mg total) by mouth daily. For 2 weeks and then once a day as needed for allergies  Dispense: 30 tablet; Refill: 1

## 2014-03-13 ENCOUNTER — Ambulatory Visit: Payer: 59 | Admitting: Internal Medicine

## 2014-07-28 ENCOUNTER — Ambulatory Visit
Admission: RE | Admit: 2014-07-28 | Discharge: 2014-07-28 | Disposition: A | Discharge status: Home / Self Care | Payer: Medicare Other | Source: Ambulatory Visit | Attending: Internal Medicine | Admitting: Internal Medicine

## 2014-07-28 ENCOUNTER — Ambulatory Visit (INDEPENDENT_AMBULATORY_CARE_PROVIDER_SITE_OTHER): Payer: Medicare Other | Admitting: Internal Medicine

## 2014-07-28 ENCOUNTER — Encounter: Payer: Self-pay | Admitting: Internal Medicine

## 2014-07-28 ENCOUNTER — Ambulatory Visit (INDEPENDENT_AMBULATORY_CARE_PROVIDER_SITE_OTHER): Payer: Medicare Other

## 2014-07-28 VITALS — BP 148/78 | HR 61 | Temp 98.8°F | Wt 233.0 lb

## 2014-07-28 DIAGNOSIS — M792 Neuralgia and neuritis, unspecified: Secondary | ICD-10-CM

## 2014-07-28 DIAGNOSIS — E041 Nontoxic single thyroid nodule: Secondary | ICD-10-CM | POA: Diagnosis not present

## 2014-07-28 DIAGNOSIS — R7309 Other abnormal glucose: Secondary | ICD-10-CM | POA: Diagnosis not present

## 2014-07-28 DIAGNOSIS — M25552 Pain in left hip: Secondary | ICD-10-CM | POA: Diagnosis not present

## 2014-07-28 DIAGNOSIS — M47816 Spondylosis without myelopathy or radiculopathy, lumbar region: Secondary | ICD-10-CM | POA: Diagnosis not present

## 2014-07-28 DIAGNOSIS — Z720 Tobacco use: Secondary | ICD-10-CM

## 2014-07-28 DIAGNOSIS — Z86010 Personal history of colonic polyps: Secondary | ICD-10-CM

## 2014-07-28 DIAGNOSIS — Z23 Encounter for immunization: Secondary | ICD-10-CM | POA: Diagnosis not present

## 2014-07-28 DIAGNOSIS — M1712 Unilateral primary osteoarthritis, left knee: Secondary | ICD-10-CM

## 2014-07-28 DIAGNOSIS — M4316 Spondylolisthesis, lumbar region: Secondary | ICD-10-CM | POA: Diagnosis not present

## 2014-07-28 DIAGNOSIS — R1013 Epigastric pain: Secondary | ICD-10-CM | POA: Diagnosis not present

## 2014-07-28 DIAGNOSIS — F17200 Nicotine dependence, unspecified, uncomplicated: Secondary | ICD-10-CM

## 2014-07-28 DIAGNOSIS — R739 Hyperglycemia, unspecified: Secondary | ICD-10-CM

## 2014-07-28 DIAGNOSIS — M5136 Other intervertebral disc degeneration, lumbar region: Secondary | ICD-10-CM | POA: Diagnosis not present

## 2014-07-28 DIAGNOSIS — G5792 Unspecified mononeuropathy of left lower limb: Secondary | ICD-10-CM

## 2014-07-28 DIAGNOSIS — Z860101 Personal history of adenomatous and serrated colon polyps: Secondary | ICD-10-CM

## 2014-07-28 DIAGNOSIS — R928 Other abnormal and inconclusive findings on diagnostic imaging of breast: Secondary | ICD-10-CM

## 2014-07-28 DIAGNOSIS — I10 Essential (primary) hypertension: Secondary | ICD-10-CM

## 2014-07-28 MED ORDER — DICLOFENAC SODIUM 1 % TD GEL: 4.0000 g | Freq: Four times a day (QID) | TRANSDERMAL | Status: DC

## 2014-07-28 NOTE — Progress Notes (Signed)
Patient ID: Rachel York, female   DOB: [birth year], 64 y.o.   MRN: [id number]   Location:  [facility]  Code Status: has not yet completed advance directive  No Known Allergies  Chief Complaint  Patient presents with  . Medical Management of Chronic Issues    hyperglycemia, blood pressure, GERD  . Joint Swelling    and hurting, left leg worse for 2-3 months  . Abdominal Pain    not daily     HPI: Patient is a 64 y.o.  seen in the office today for medical mgt of chronic diseases.  She missed her august appt and the next available was then January.    Stil having stomach issues.    Knee pain has gotten worse with additions.  Left leg swell on lateral aspect and sometimes it goes straight down front of leg into her foot.  She feels like if she pressed hard enough on shin, it would break.  Sometimes can barely walk on it.  Sometimes whole foot will feel numb.  Sometimes toes swell but not whole foot.  No correlation with amount of activity.  Usually wakes up with it.  Climbing in and out of bed is hard and getting to restroom difficult at times.   If sits in low chair, hard to get up out of chair.  Sometimes lateral aspect of left thigh goes numb.  Previous xrays 09/12/12 showed moderate OA with spurs--all three compartments. Sometimes takes acetaminophen, elevates, sometimes uses heat or ice, some topical agents.    Had been doing better with weight but not walking now due to pain.  Loved doing her walking.    Occasionally if stands too long, will get some pain in the lower back.  Sometimes pain down leg present w/o any back pain.  Belly pain not constant, but when it hurts, it will last a few hours to a couple of days.  Could not take the medication Dr. Bubba Camp gave her--could not function with it and did not relieve pain anyway.  Insurance would not pay for genetics evaluation due to multiple colon polyps.  She now has medicare and [employer]  care.    Also insurance wouldn't cover repeat mammo either.  Needs f/u hba1c.  Usually pretty careful with her diet.  Does not do a lot of fried and always has eaten a lot of veggies.  Not as good with fruit in the winter. Does not eat a lot of sweets, but does like cobbler with ice cream.  Study ends soon with hctz medication.  Did smoke early this am.  Bp was up here today.  Normally not over 130/95.    Knows she has a cataract.  Discussed that if she needs a referral I would send her to Dr. Katy Apo.    She goes to Dr. Hilarie Fredrickson for GI.  She need f/u diagnostic imaging for her breasts.  Prior abnormality was noted.  Review of Systems:  Review of Systems  Constitutional: Negative for fever, chills and malaise/fatigue.  HENT: Negative for congestion.   Eyes: Negative for blurred vision.       Right cataract previously diagnosed  Respiratory: Negative for cough and shortness of breath.   Cardiovascular: Positive for leg swelling. Negative for chest pain.       Left greater than right off and on  Gastrointestinal: Positive for abdominal pain. Negative for heartburn, nausea, vomiting, diarrhea, constipation, blood in stool and melena.  Genitourinary:  Negative for dysuria.  Musculoskeletal: Positive for myalgias, back pain and joint pain. Negative for falls.  Skin: Negative for rash.  Neurological: Positive for sensory change. Negative for dizziness, loss of consciousness and weakness.       Left leg  Endo/Heme/Allergies: Does not bruise/bleed easily.  Psychiatric/Behavioral: Negative for depression and memory loss.     Past Medical History  Diagnosis Date  . Hypertension   . Abdominal pain   . Arthritis   . GERD (gastroesophageal reflux disease)   . Allergy   . Cataract   . Hyperlipidemia     no meds  . Abdominal pain, epigastric   . Nontoxic uninodular goiter   . Tobacco use disorder   . Abdominal tenderness, periumbilic   . Family history of diabetes mellitus   .  Routine general medical examination at a health care facility   . Unspecified essential hypertension   . Pain in joint, site unspecified   . Lumbago     Past Surgical History  Procedure Laterality Date  . Cholecystectomy    . Abdominal hysterectomy    . Colonoscopy      Social History:   reports that she has been smoking Cigarettes.  She has been smoking about 0.50 packs per day. She has never used smokeless tobacco. She reports that she does not drink alcohol or use illicit drugs.  Family History  Problem Relation Age of Onset  . Colon cancer Neg Hx   . Esophageal cancer Neg Hx   . Rectal cancer Neg Hx   . Stomach cancer Neg Hx   . Cancer Mother     breast  . Diabetes Father   . Heart disease Father     Medications: Patient's Medications  New Prescriptions   No medications on file  Previous Medications   CETIRIZINE (ZYRTEC) 10 MG TABLET    Take 1 tablet (10 mg total) by mouth daily. For 2 weeks and then once a day as needed for allergies   HYDROCHLOROTHIAZIDE (MICROZIDE) 12.5 MG CAPSULE    Take 12.5 mg by mouth daily.    HYDROXYZINE (ATARAX/VISTARIL) 10 MG TABLET    Take 1 tablet (10 mg total) by mouth 3 (three) times daily as needed.   PREDNISONE (DELTASONE) 20 MG TABLET    Take one tablet daily for 5 days   TETRAHYDROZOLINE (VISINE) 0.05 % OPHTHALMIC SOLUTION    Place 1 drop into both eyes 2 (two) times daily.  Modified Medications   No medications on file  Discontinued Medications   No medications on file     Physical Exam: Filed Vitals:   07/28/14 0954  BP: 148/78  Pulse: 61  Temp: 98.8 F (37.1 C)  TempSrc: Oral  Weight: 233 lb (105.688 kg)  SpO2: 97%  Physical Exam  Constitutional: She is oriented to person, place, and time. She appears well-developed and well-nourished. No distress.  Cardiovascular: Normal rate, regular rhythm, normal heart sounds and intact distal pulses.   Warm feet bilaterally with intact dp and pt pulses  Pulmonary/Chest: Effort  normal and breath sounds normal. No respiratory distress.  Abdominal: Soft. Bowel sounds are normal. She exhibits no distension and no mass. There is no tenderness.  Musculoskeletal: She exhibits no tenderness.  Left lateral knee tender and swollen and down her left shin  Neurological: She is alert and oriented to person, place, and time.  Skin: Skin is warm and dry.  Psychiatric: She has a normal mood and affect.     Labs reviewed:  Lab Results  Component Value Date   HGBA1C 6.4* 04/29/2013    Past Procedures: Left leg xrays reviewed   Assessment/Plan 1. Primary osteoarthritis of left knee - severe it seems now -has some new symptoms that seem to be referred from her back at this point so will do imaging of back, left hip and pelvis to further assess -try voltaren get to left knee also - DG Lumbar Spine Complete; Future - DG HIP UNILAT WITH PELVIS 2-3 VIEWS LEFT; Future - diclofenac sodium (VOLTAREN) 1 % GEL; Apply 4 g topically 4 (four) times daily.  Dispense: 100 g; Refill: 3  2. Neuropathic pain, leg, left -suspect this is from her back - DG Lumbar Spine Complete; Future - DG HIP UNILAT WITH PELVIS 2-3 VIEWS LEFT; Future - diclofenac sodium (VOLTAREN) 1 % GEL; Apply 4 g topically 4 (four) times daily.  Dispense: 100 g; Refill: 3  3. Hyperglycemia -f/u labs, has not been able to exercise so I would not be surprised if she is now in diabetic range - Comprehensive metabolic panel - Hemoglobin A1c  4. Hx of adenomatous colonic polyps -was to f/u for another cscope but her prior insurance was not going to cover it but now has medicare so will send her back to Dr. Hilarie Fredrickson for Flonnie Overman - Ambulatory referral to Gastroenterology  5. Abdominal pain, epigastric -continues to double her over at times--seems to be in small intestine region--f/u Dr Hilarie Fredrickson - CBC With differential/Platelet - Comprehensive metabolic panel - Ambulatory referral to Gastroenterology  6. Tobacco  use disorder -counseled on smoking cessation and discussed financial savings  7. Essential hypertension, benign -bp elevated, but smoking this am -continues to participate in study at [facility] which ends soon  8. Nontoxic uninodular goiter - will f/u labs: - TSH  9. Abnormal mammogram of right breast - was unable to f/u timely as prior insurance was not covering recommended imaging; may also need diagnostic US, but will start with the mammo and can always add [place] if needed - MM Digital Diagnostic Bilat; Future  Labs/tests ordered: Orders Placed This Encounter  Procedures  . DG Lumbar Spine Complete    Standing Status: Future     Number of Occurrences:      Standing Expiration Date: 09/26/2015    Order Specific Question:  Reason for Exam (SYMPTOM  OR DIAGNOSIS REQUIRED)    Answer:  neuropathic pain down left leg, some low back pain on prolonged standing    Order Specific Question:  Preferred imaging location?    Answer:  [facility]  . DG HIP UNILAT WITH PELVIS 2-3 VIEWS LEFT    Standing Status: Future     Number of Occurrences:      Standing Expiration Date: 09/26/2015    Order Specific Question:  Reason for Exam (SYMPTOM  OR DIAGNOSIS REQUIRED)    Answer:  left leg and thigh neuropathic pain    Order Specific Question:  Preferred imaging location?    Answer:  [facility]  . MM Digital Diagnostic Bilat    Standing Status: Future     Number of Occurrences:      Standing Expiration Date: 09/26/2015    Order Specific Question:  Reason for Exam (SYMPTOM  OR DIAGNOSIS REQUIRED)    Answer:  h/o abnormal mammogram on right; was previously unable to f/u due to cost    Order Specific Question:  Preferred imaging location?    Answer:  [facility]  . CBC With differential/Platelet  .  Comprehensive metabolic panel  . Hemoglobin A1c  . TSH  . Ambulatory referral to Gastroenterology    Referral Priority:  Routine    Referral Type:  Consultation    Referral Reason:   Specialty Services Required    Requested Specialty:  Gastroenterology    Number of Visits Requested:  1    Next appt:  3 mos  And prn  Paras Kreider L. Cartez Mogle, D.O. [facility] [address], [place] [address] Cell Phone (Mon-Fri 8am-5pm):  [phone] On Call:  [phone] & follow prompts after 5pm & weekends Office Phone:  [phone] Office Fax:  [phone]

## 2014-07-29 ENCOUNTER — Telehealth: Payer: Self-pay | Admitting: *Deleted

## 2014-07-29 ENCOUNTER — Encounter: Payer: Self-pay | Admitting: Internal Medicine

## 2014-07-29 DIAGNOSIS — M792 Neuralgia and neuritis, unspecified: Secondary | ICD-10-CM

## 2014-07-29 DIAGNOSIS — M1712 Unilateral primary osteoarthritis, left knee: Secondary | ICD-10-CM

## 2014-07-29 LAB — CBC WITH DIFFERENTIAL
Basophils Absolute: 0.1 10*3/uL (ref 0.0–0.2)
Basos: 1 %
Eos: 2 %
Eosinophils Absolute: 0.1 10*3/uL (ref 0.0–0.4)
HCT: 41.4 % (ref 34.0–46.6)
Hemoglobin: 14.4 g/dL (ref 11.1–15.9)
Immature Grans (Abs): 0 10*3/uL (ref 0.0–0.1)
Immature Granulocytes: 0 %
Lymphocytes Absolute: 2.8 10*3/uL (ref 0.7–3.1)
Lymphs: 43 %
MCH: 30.4 pg (ref 26.6–33.0)
MCHC: 34.8 g/dL (ref 31.5–35.7)
MCV: 88 fL (ref 79–97)
Monocytes Absolute: 0.5 10*3/uL (ref 0.1–0.9)
Monocytes: 8 %
Neutrophils Absolute: 3 10*3/uL (ref 1.4–7.0)
Neutrophils Relative %: 46 %
Platelets: 317 10*3/uL (ref 150–379)
RBC: 4.73 x10E6/uL (ref 3.77–5.28)
RDW: 13.6 % (ref 12.3–15.4)
WBC: 6.5 10*3/uL (ref 3.4–10.8)

## 2014-07-29 LAB — COMPREHENSIVE METABOLIC PANEL
ALT: 6 IU/L (ref 0–32)
AST: 9 IU/L (ref 0–40)
Albumin/Globulin Ratio: 1.7 (ref 1.1–2.5)
Albumin: 4 g/dL (ref 3.6–4.8)
Alkaline Phosphatase: 93 IU/L (ref 39–117)
BUN/Creatinine Ratio: 18 (ref 11–26)
BUN: 13 mg/dL (ref 8–27)
CO2: 26 mmol/L (ref 18–29)
Calcium: 9.7 mg/dL (ref 8.7–10.3)
Chloride: 102 mmol/L (ref 97–108)
Creatinine, Ser: 0.74 mg/dL (ref 0.57–1.00)
GFR calc Af Amer: 99 mL/min/{1.73_m2} (ref >59)
GFR calc non Af Amer: 86 mL/min/{1.73_m2} (ref >59)
Globulin, Total: 2.3 g/dL (ref 1.5–4.5)
Glucose: 99 mg/dL (ref 65–99)
Potassium: 5.6 mmol/L — ABNORMAL HIGH (ref 3.5–5.2)
Sodium: 144 mmol/L (ref 134–144)
Total Bilirubin: 0.7 mg/dL (ref 0.0–1.2)
Total Protein: 6.3 g/dL (ref 6.0–8.5)

## 2014-07-29 LAB — HEMOGLOBIN A1C
Est. average glucose Bld gHb Est-mCnc: 126 mg/dL
Hgb A1c MFr Bld: 6 % — ABNORMAL HIGH (ref 4.8–5.6)

## 2014-07-29 LAB — TSH: TSH: 0.845 u[IU]/mL (ref 0.450–4.500)

## 2014-07-29 NOTE — Telephone Encounter (Signed)
Patient called and stated that the Voltaren Gel you prescribed yesterday is not covered by her insurance and wants something else called in. Patient called insurance and they will cover Diclofenac Sodium (generic). Please Advise.

## 2014-07-29 NOTE — Telephone Encounter (Signed)
Generic gel or generic pill?  Generic gel is fine.  Pill is not due to her kidneys.

## 2014-07-30 ENCOUNTER — Other Ambulatory Visit: Payer: Self-pay | Admitting: Internal Medicine

## 2014-07-30 DIAGNOSIS — N63 Unspecified lump in unspecified breast: Secondary | ICD-10-CM

## 2014-07-30 MED ORDER — DICLOFENAC SODIUM 1 % TD GEL: 4.0000 g | Freq: Four times a day (QID) | TRANSDERMAL | Status: DC

## 2014-07-30 NOTE — Telephone Encounter (Signed)
Generic faxed to pharmacy and patient notified.

## 2014-07-31 ENCOUNTER — Ambulatory Visit (INDEPENDENT_AMBULATORY_CARE_PROVIDER_SITE_OTHER): Payer: Medicare Other | Admitting: Internal Medicine

## 2014-07-31 ENCOUNTER — Encounter: Payer: Self-pay | Admitting: Internal Medicine

## 2014-07-31 VITALS — BP 148/80 | HR 78 | Temp 98.6°F | Resp 18 | Ht 65.5 in | Wt 238.2 lb

## 2014-07-31 DIAGNOSIS — M792 Neuralgia and neuritis, unspecified: Secondary | ICD-10-CM

## 2014-07-31 DIAGNOSIS — M1712 Unilateral primary osteoarthritis, left knee: Secondary | ICD-10-CM

## 2014-07-31 DIAGNOSIS — G5792 Unspecified mononeuropathy of left lower limb: Secondary | ICD-10-CM | POA: Diagnosis not present

## 2014-07-31 NOTE — Progress Notes (Signed)
Patient ID: Rachel York, female   DOB: Jul 14, 1951, 64 y.o.   MRN: [id number]   Location:  [facility] / [place]   No Known Allergies  Chief Complaint  Patient presents with  . Acute Visit    left knee injection    HPI: Patient is a 64 y.o. black female seen in the office today for acute visit for left knee steroid injection.  She has tricompartmental arthritis of the left knee. She also has pain in her lower back and left hip/thigh.  Xray results of back and left hip reviewed with her.    Review of Systems:  Review of Systems  Constitutional: Negative for fever and chills.  Musculoskeletal: Positive for back pain and joint pain. Negative for falls.       Difficulty walking any distance due to pain     Past Medical History  Diagnosis Date  . Hypertension   . Abdominal pain   . Arthritis   . GERD (gastroesophageal reflux disease)   . Allergy   . Cataract   . Hyperlipidemia     no meds  . Abdominal pain, epigastric   . Nontoxic uninodular goiter   . Tobacco use disorder   . Abdominal tenderness, periumbilic   . Family history of diabetes mellitus   . Routine general medical examination at a health care facility   . Unspecified essential hypertension   . Pain in joint, site unspecified   . Lumbago     Past Surgical History  Procedure Laterality Date  . Cholecystectomy    . Abdominal hysterectomy    . Colonoscopy      Social History:   reports that she has been smoking Cigarettes.  She has been smoking about 0.50 packs per day. She has never used smokeless tobacco. She reports that she does not drink alcohol or use illicit drugs.  Family History  Problem Relation Age of Onset  . Colon cancer Neg Hx   . Esophageal cancer Neg Hx   . Rectal cancer Neg Hx   . Stomach cancer Neg Hx   . Cancer Mother     breast  . Diabetes Father   . Heart disease Father     Medications: Patient's Medications  New Prescriptions   No  medications on file  Previous Medications   DICLOFENAC SODIUM (VOLTAREN) 1 % GEL    Apply 4 g topically 4 (four) times daily.   HYDROCHLOROTHIAZIDE (MICROZIDE) 12.5 MG CAPSULE    Take 12.5 mg by mouth daily.   Modified Medications   No medications on file  Discontinued Medications   No medications on file     Physical Exam: Filed Vitals:   07/31/14 1540  BP: 148/80  Pulse: 78  Temp: 98.6 F (37 C)  TempSrc: Oral  Resp: 18  Height: 5' 5.5" (1.664 m)  Weight: 238 lb 3.2 oz (108.047 kg)  SpO2: 96%  Physical Exam  Labs reviewed: Basic Metabolic Panel:  Recent Labs  07/28/14 1058  NA 144  K 5.6*  CL 102  CO2 26  GLUCOSE 99  BUN 13  CREATININE 0.74  CALCIUM 9.7  TSH 0.845   Liver Function Tests:  Recent Labs  07/28/14 1058  AST 9  ALT 6  ALKPHOS 93  BILITOT 0.7  PROT 6.3   No results for input(s): LIPASE, AMYLASE in the last 8760 hours. No results for input(s): AMMONIA in the last 8760 hours. CBC:  Recent Labs  07/28/14 1058  WBC  6.5  NEUTROABS 3.0  HGB 14.4  HCT 41.4  MCV 88  PLT 317   Lipid Panel: No results for input(s): CHOL, HDL, LDLCALC, TRIG, CHOLHDL, LDLDIRECT in the last 8760 hours. Lab Results  Component Value Date   HGBA1C 6.0* 07/28/2014    Past Procedures: 07/28/14:  Left hip with pelvis:  Mild degenerative changes of the hip joints are noted bilaterally as well as the lower lumbar spine. No acute fracture or dislocation is noted. No gross soft tissue abnormality is seen.  07/28/14:  Lumbar spine complete xrays:  Five lumbar type vertebral bodies are well visualized. Vertebral body height is well maintained. Mild facet hypertrophic changes are seen. Mild anterolisthesis of L4 on L5 of a degenerative nature is noted. Mild disc space narrowing is noted at L4-5 as well.  Assessment/Plan 1. Primary osteoarthritis of left knee -pt consent was obtained for knee injection--risks reviewed primarily being infection (presentation discussed  should this unfortunately occur) -planned location for steroid/lidocaine injection determined -left medial knee was cleansed with betadine x 2 -knee was then sprayed with topical anesthetic -32ml of lidocaine mixed with 62ml of kenalog was then injected into the knee via medial approach -bandaid applied and betadine cleaned off -pt noted improvement, but knee was "numb inside"  2. Neuropathic pain, leg, left -suspect this is coming from her back based on her xrays -will see if any improves after treatment of her knee pain as above  Preventive care: preop 3/1 and cscope 3/15 Hasn't heard about mammo yet  Next appt:  Keep appt in April  [name] L. Alejah Aristizabal, D.O. [facility] [address], [place] [address] Cell Phone (Mon-Fri 8am-5pm):  [phone] On Call:  [phone] & follow prompts after 5pm & weekends Office Phone:  [phone] Office Fax:  [phone]

## 2014-08-03 MED ORDER — LIDOCAINE HCL (PF) 1 % IJ SOLN: 1.0000 mL | Freq: Once | INTRAMUSCULAR | Status: DC

## 2014-08-03 MED ORDER — TRIAMCINOLONE ACETONIDE 10 MG/ML IJ SUSP: 10.0000 mg | Freq: Once | INTRAMUSCULAR | Status: DC

## 2014-08-25 ENCOUNTER — Ambulatory Visit
Admission: RE | Admit: 2014-08-25 | Discharge: 2014-08-25 | Disposition: A | Discharge status: Home / Self Care | Payer: Medicare Other | Source: Ambulatory Visit | Attending: Internal Medicine | Admitting: Internal Medicine

## 2014-08-25 DIAGNOSIS — N63 Unspecified lump in unspecified breast: Secondary | ICD-10-CM

## 2014-08-28 DIAGNOSIS — H25011 Cortical age-related cataract, right eye: Secondary | ICD-10-CM | POA: Diagnosis not present

## 2014-08-28 DIAGNOSIS — H2511 Age-related nuclear cataract, right eye: Secondary | ICD-10-CM | POA: Diagnosis not present

## 2014-08-28 DIAGNOSIS — H2513 Age-related nuclear cataract, bilateral: Secondary | ICD-10-CM | POA: Diagnosis not present

## 2014-08-28 DIAGNOSIS — H25013 Cortical age-related cataract, bilateral: Secondary | ICD-10-CM | POA: Diagnosis not present

## 2014-08-29 ENCOUNTER — Telehealth: Payer: Self-pay | Admitting: *Deleted

## 2014-08-29 DIAGNOSIS — R1030 Lower abdominal pain, unspecified: Secondary | ICD-10-CM

## 2014-08-29 NOTE — Telephone Encounter (Signed)
Patient called and stated that she is still having abdomen pain and wants something called in. Stated that she has discussed this with you. It hasn't gotten worse but it is no better either. Feels like sustained labor. No blood when she goes to the bathroom. No fever. Please Advise.

## 2014-08-30 NOTE — Telephone Encounter (Signed)
Let's get a CT abdomen/pelvis with contrast to determine the cause of this ongoing pain.  She does not have a uterus or ovaries so those are out.  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  If the current CT is unrevealing, I will refer her back to Dr. Hilarie Fredrickson for further assessment.

## 2014-09-01 NOTE — Telephone Encounter (Signed)
LMOM to Return call.  

## 2014-09-01 NOTE — Telephone Encounter (Signed)
Patient Notified and Order placed.

## 2014-09-04 ENCOUNTER — Other Ambulatory Visit: Payer: Medicare Other

## 2014-09-12 ENCOUNTER — Ambulatory Visit
Admission: RE | Admit: 2014-09-12 | Discharge: 2014-09-12 | Disposition: A | Discharge status: Home / Self Care | Payer: Medicare Other | Source: Ambulatory Visit | Attending: Internal Medicine | Admitting: Internal Medicine

## 2014-09-12 ENCOUNTER — Other Ambulatory Visit: Payer: Medicare Other

## 2014-09-12 ENCOUNTER — Other Ambulatory Visit: Payer: Self-pay | Admitting: Internal Medicine

## 2014-09-12 DIAGNOSIS — R1033 Periumbilical pain: Secondary | ICD-10-CM | POA: Diagnosis not present

## 2014-09-12 DIAGNOSIS — R1030 Lower abdominal pain, unspecified: Secondary | ICD-10-CM

## 2014-09-12 MED ORDER — IOHEXOL 300 MG/ML  SOLN
125.0000 mL | Freq: Once | INTRAMUSCULAR | Status: AC | PRN
  Administered 2014-09-12: 125 mL via INTRAVENOUS

## 2014-09-23 ENCOUNTER — Ambulatory Visit (AMBULATORY_SURGERY_CENTER): Payer: Self-pay

## 2014-09-23 VITALS — Ht 66.75 in | Wt 228.8 lb

## 2014-09-23 DIAGNOSIS — Z86010 Personal history of colonic polyps: Secondary | ICD-10-CM

## 2014-09-23 MED ORDER — MOVIPREP 100 G PO SOLR: ORAL | Status: DC

## 2014-09-23 NOTE — Progress Notes (Signed)
Per pt, no allergies to soy or egg products.Pt not taking any weight loss meds or using  O2 at home.    Pt is scheduled for cataract surgery on 09/24/14 and 10/01/14.Pt was informed to call our office if she has any problems with the surgery/sedation and if she needs to reschedule the colonoscopy on 10/07/14.Pt understood.

## 2014-09-24 DIAGNOSIS — H25012 Cortical age-related cataract, left eye: Secondary | ICD-10-CM | POA: Diagnosis not present

## 2014-09-24 DIAGNOSIS — H2511 Age-related nuclear cataract, right eye: Secondary | ICD-10-CM | POA: Diagnosis not present

## 2014-09-24 DIAGNOSIS — H2512 Age-related nuclear cataract, left eye: Secondary | ICD-10-CM | POA: Diagnosis not present

## 2014-09-24 DIAGNOSIS — H25011 Cortical age-related cataract, right eye: Secondary | ICD-10-CM | POA: Diagnosis not present

## 2014-09-25 ENCOUNTER — Telehealth: Payer: Self-pay | Admitting: Internal Medicine

## 2014-09-25 DIAGNOSIS — Z86010 Personal history of colonic polyps: Secondary | ICD-10-CM

## 2014-09-25 MED ORDER — MOVIPREP 100 G PO SOLR: ORAL | Status: DC

## 2014-09-25 NOTE — Telephone Encounter (Signed)
Rx sent. Patient advised. 

## 2014-10-01 DIAGNOSIS — H2512 Age-related nuclear cataract, left eye: Secondary | ICD-10-CM | POA: Diagnosis not present

## 2014-10-01 DIAGNOSIS — H25012 Cortical age-related cataract, left eye: Secondary | ICD-10-CM | POA: Diagnosis not present

## 2014-10-04 DIAGNOSIS — H2000 Unspecified acute and subacute iridocyclitis: Secondary | ICD-10-CM | POA: Diagnosis not present

## 2014-10-07 ENCOUNTER — Encounter: Payer: Self-pay | Admitting: Internal Medicine

## 2014-10-07 ENCOUNTER — Ambulatory Visit (AMBULATORY_SURGERY_CENTER): Payer: Medicare Other | Admitting: Internal Medicine

## 2014-10-07 VITALS — BP 138/73 | HR 59 | Temp 95.7°F | Resp 15 | Ht 66.75 in | Wt 228.0 lb

## 2014-10-07 DIAGNOSIS — K635 Polyp of colon: Secondary | ICD-10-CM | POA: Diagnosis not present

## 2014-10-07 DIAGNOSIS — D125 Benign neoplasm of sigmoid colon: Secondary | ICD-10-CM | POA: Diagnosis not present

## 2014-10-07 DIAGNOSIS — Z86010 Personal history of colonic polyps: Secondary | ICD-10-CM | POA: Diagnosis not present

## 2014-10-07 DIAGNOSIS — D123 Benign neoplasm of transverse colon: Secondary | ICD-10-CM

## 2014-10-07 DIAGNOSIS — D124 Benign neoplasm of descending colon: Secondary | ICD-10-CM | POA: Diagnosis not present

## 2014-10-07 DIAGNOSIS — D126 Benign neoplasm of colon, unspecified: Secondary | ICD-10-CM

## 2014-10-07 DIAGNOSIS — D122 Benign neoplasm of ascending colon: Secondary | ICD-10-CM | POA: Diagnosis not present

## 2014-10-07 MED ORDER — SODIUM CHLORIDE 0.9 % IV SOLN: 500.0000 mL | INTRAVENOUS | Status: DC

## 2014-10-07 MED ORDER — HYOSCYAMINE SULFATE 0.125 MG SL SUBL: SUBLINGUAL_TABLET | SUBLINGUAL | Status: DC

## 2014-10-07 NOTE — Progress Notes (Signed)
Called to room to assist during endoscopic procedure.  Patient ID and intended procedure confirmed with present staff. Received instructions for my participation in the procedure from the performing physician.  

## 2014-10-07 NOTE — Op Note (Signed)
[facility]  [address] [place], [address]   COLONOSCOPY PROCEDURE REPORT  PATIENT: Rachel, York  MR#: [id number] BIRTHDATE: 12-06-1950 , 25  yrs. old GENDER: female ENDOSCOPIST: Jerene Bears, MD REFERRED BY: PROCEDURE DATE:  10/07/2014 PROCEDURE:   Colonoscopy with snare polypectomy First Screening Colonoscopy - Avg.  risk and is 50 yrs.  old or older - No.  Prior Negative Screening - Now for repeat screening. N/A  History of Adenoma - Now for follow-up colonoscopy & has been > or = to 3 yrs.  No.  It has been less than 3 yrs since last colonoscopy.  Medical reason. ASA CLASS:   Class III INDICATIONS:high risk patient with personal history of multiple colonic polyps, last colonoscopy April 2014 (recommended 3-6 month followup at that time). MEDICATIONS: Monitored anesthesia care and Propofol 400 mg IV  DESCRIPTION OF PROCEDURE:   After the risks benefits and alternatives of the procedure were thoroughly explained, informed consent was obtained.  The digital rectal exam revealed no rectal mass.   The [id number]  endoscope was introduced through the anus and advanced to the cecum, which was identified by both the appendix and ileocecal valve. No adverse events experienced. The quality of the prep was (MoviPrep was used) good.  The instrument was then slowly withdrawn as the colon was fully examined.      COLON FINDINGS: Five sessile polyps ranging from 5 to 2mm in size were found in the ascending colon (2) and transverse colon (3). Polypectomies were performed with a cold snare.  The resection was complete, the polyp tissue was completely retrieved and sent to histology.   Nine sessile polyps ranging from 5 to 82mm in size were found in the descending colon.  Polypectomies were performed using snare cautery (1) and with a cold snare (8).  The resection was complete, the polyp tissue was completely retrieved and sent to histology.   Multiple  sessile polyps ranging from 5 to 38mm in size were found in the sigmoid colon.  Twenty-six of these polyps were removed.  Polypectomies were performed using snare cautery (2) and with a cold snare (24).  The resection was complete, the polyp tissue was completely retrieved and sent to histology.  Multiple polyps remain in the left colon, but could not be removed completely today due to colonic spasm and time required to remove additional polyps. Retroflexed views revealed internal hemorrhoids. The time to cecum = 3 min 59 sec Withdrawal time = 43 min 44 sec The scope was withdrawn and the procedure completed.  COMPLICATIONS: There were no immediate complications.  ENDOSCOPIC IMPRESSION: 1.   Five sessile polyps ranging from 5 to 5mm in size were found in the ascending colon and transverse colon; polypectomies were performed with a cold snare 2.   Nine sessile polyps ranging from 5 to 103mm in size were found in the descending colon; polypectomies were performed using snare cautery and with a cold snare 3.   26 sessile polyps ranging from 6 to 27mm in size were found in the sigmoid colon; polypectomies were performed using snare cautery and with a cold snare  RECOMMENDATIONS: 1.  Await pathology results 2.  Hold Aspirin and all other NSAIDS for 2 weeks. 3.  Medical genetics referral to evaluate for colon polyposis syndrome, suspicious for serrated polyposis syndrome 4.  Timing of repeat colonoscopy will be determined by pathology findings. 5.  You will receive a letter within 1-2 weeks with the results of your  biopsy as well as final recommendations.  Please call my office if you have not received a letter after 3 weeks.  eSigned:  Jerene Bears, MD 10/07/2014 10:12 AM   cc: Hollace Kinnier, DO and The Patient   PATIENT NAME:  Rachel York, Rachel York MR#: [id number]

## 2014-10-07 NOTE — Progress Notes (Signed)
Report to PACU, RN, vss, BBS= Clear.  

## 2014-10-07 NOTE — Patient Instructions (Signed)
Discharge instructions given. Handouts on polyps. Holds aspirin and allo other NSAIDS for 2 weeks. Resume previous medications. YOU HAD AN ENDOSCOPIC PROCEDURE TODAY AT [facility]:   Refer to the procedure report that was given to you for any specific questions about what was found during the examination.  If the procedure report does not answer your questions, please call your gastroenterologist to clarify.  If you requested that your care partner not be given the details of your procedure findings, then the procedure report has been included in a sealed envelope for you to review at your convenience later.  YOU SHOULD EXPECT: Some feelings of bloating in the abdomen. Passage of more gas than usual.  Walking can help get rid of the air that was put into your GI tract during the procedure and reduce the bloating. If you had a lower endoscopy (such as a colonoscopy or flexible sigmoidoscopy) you may notice spotting of blood in your stool or on the toilet paper. If you underwent a bowel prep for your procedure, you may not have a normal bowel movement for a few days.  Please Note:  You might notice some irritation and congestion in your nose or some drainage.  This is from the oxygen used during your procedure.  There is no need for concern and it should clear up in a day or so.  SYMPTOMS TO REPORT IMMEDIATELY:   Following lower endoscopy (colonoscopy or flexible sigmoidoscopy):  Excessive amounts of blood in the stool  Significant tenderness or worsening of abdominal pains  Swelling of the abdomen that is new, acute  Fever of 100F or higher   For urgent or emergent issues, a gastroenterologist can be reached at any hour by calling [phone].   DIET: Your first meal following the procedure should be a small meal and then it is ok to progress to your normal diet. Heavy or fried foods are harder to digest and may make you feel nauseous or bloated.  Likewise, meals heavy in  dairy and vegetables can increase bloating.  Drink plenty of fluids but you should avoid alcoholic beverages for 24 hours.  ACTIVITY:  You should plan to take it easy for the rest of today and you should NOT DRIVE or use heavy machinery until tomorrow (because of the sedation medicines used during the test).    FOLLOW UP: Our staff will call the number listed on your records the next business day following your procedure to check on you and address any questions or concerns that you may have regarding the information given to you following your procedure. If we do not reach you, we will leave a message.  However, if you are feeling well and you are not experiencing any problems, there is no need to return our call.  We will assume that you have returned to your regular daily activities without incident.  If any biopsies were taken you will be contacted by phone or by letter within the next 1-3 weeks.  Please call us at [phone] if you have not heard about the biopsies in 3 weeks.    SIGNATURES/CONFIDENTIALITY: You and/or your care partner have signed paperwork which will be entered into your electronic medical record.  These signatures attest to the fact that that the information above on your After Visit Summary has been reviewed and is understood.  Full responsibility of the confidentiality of this discharge information lies with you and/or your care-partner.

## 2014-10-08 ENCOUNTER — Telehealth: Payer: Self-pay | Admitting: *Deleted

## 2014-10-08 ENCOUNTER — Other Ambulatory Visit: Payer: Self-pay

## 2014-10-08 DIAGNOSIS — Z860100 Personal history of colon polyps, unspecified: Secondary | ICD-10-CM

## 2014-10-08 NOTE — Telephone Encounter (Signed)
  Follow up Call-  Call back number 10/07/2014 10/23/2012  Post procedure Call Back phone  # [phone]  Permission to leave phone message Yes Yes     Patient questions:  Do you have a fever, pain , or abdominal swelling? No. Pain Score  0 *  Have you tolerated food without any problems? Yes.    Have you been able to return to your normal activities? Yes.    Do you have any questions about your discharge instructions: Diet   No. Medications  No. Follow up visit  No.  Do you have questions or concerns about your Care? No.  Actions: * If pain score is 4 or above: No action needed, pain <4.

## 2014-10-09 ENCOUNTER — Telehealth: Payer: Self-pay | Admitting: Genetic Counselor

## 2014-10-09 NOTE — Telephone Encounter (Signed)
PATIENT CALLED BACK.  WANTED TO CHECK WITH KAREN POWELL REGARDING INSURANCE BEFORE SCHEDULING APPT.

## 2014-10-16 ENCOUNTER — Encounter: Payer: Self-pay | Admitting: Internal Medicine

## 2014-10-21 ENCOUNTER — Telehealth: Payer: Self-pay | Admitting: Genetic Counselor

## 2014-10-21 NOTE — Telephone Encounter (Signed)
Called pt to follow up on referral.  left message.  TG

## 2014-10-30 ENCOUNTER — Ambulatory Visit (INDEPENDENT_AMBULATORY_CARE_PROVIDER_SITE_OTHER): Payer: Medicare Other | Admitting: Internal Medicine

## 2014-10-30 ENCOUNTER — Encounter: Payer: Self-pay | Admitting: Internal Medicine

## 2014-10-30 VITALS — BP 132/86 | HR 59 | Temp 97.9°F | Resp 20 | Ht 67.0 in | Wt 228.4 lb

## 2014-10-30 DIAGNOSIS — E875 Hyperkalemia: Secondary | ICD-10-CM | POA: Diagnosis not present

## 2014-10-30 DIAGNOSIS — D369 Benign neoplasm, unspecified site: Secondary | ICD-10-CM

## 2014-10-30 DIAGNOSIS — M1712 Unilateral primary osteoarthritis, left knee: Secondary | ICD-10-CM

## 2014-10-30 DIAGNOSIS — R739 Hyperglycemia, unspecified: Secondary | ICD-10-CM

## 2014-10-30 DIAGNOSIS — R7309 Other abnormal glucose: Secondary | ICD-10-CM | POA: Diagnosis not present

## 2014-10-30 DIAGNOSIS — G5792 Unspecified mononeuropathy of left lower limb: Secondary | ICD-10-CM

## 2014-10-30 DIAGNOSIS — I10 Essential (primary) hypertension: Secondary | ICD-10-CM | POA: Diagnosis not present

## 2014-10-30 DIAGNOSIS — M792 Neuralgia and neuritis, unspecified: Secondary | ICD-10-CM

## 2014-10-30 NOTE — Progress Notes (Signed)
Patient ID: Rachel York, female   DOB: Jul 12, 1951, 64 y.o.   MRN: [id number]   Location:  [facility]  Goals of Care: Advanced Directive information Does patient have an advance directive?: No, Would patient like information on creating an advanced directive?: Yes - Educational materials given (has been given this material--importance of completion reiterated today.)   No Known Allergies  Chief Complaint  Patient presents with  . Medical Management of Chronic Issues    3 month follow-up, still having knee pain    HPI: Patient is a 64 y.o. black female seen in the office today for med mgt chronic diseases.    Did help for a while with the injection, but did not last a long time.  Has fallen twice on her knee--dog knocked her down and another time, fell off the porch (missed all of the steps)--had to call for help b/c she could not get up.  Now hurting in left hip down into foot.  Feels almost like a cramp and dissipates when wiggles it around.  Sometimes does burn or tingle also.  If she sits longer than 30 seconds, she has a lot of trouble getting up and starting to move.  Getting in and out of care is challenging.  Hard to swivel to get out.    Had two cataracts removed on 4/1, 4/9 and just had last f/u done at Dr. Kellie Moor.  Vision is much better.  No longer has cloud when she's looking.  Feels like sand in eyes--trying dry eye drops.  No longer needs glasses for distance, but still for reading.  Had cscope done 4/15--had "40 some polyps removed" and needs genetic testing done--needs to see if insurance will cover.  Is moving now also.  Having difficulty with pain.  Abdominal pain not lately, but sometimes last 24-72 hrs and muscles get tender and sore.     Review of Systems:  Review of Systems  Constitutional: Negative for fever and chills.  HENT: Negative for congestion.   Eyes: Negative for blurred vision.       Had cataract surgery    Respiratory: Negative for shortness of breath.   Cardiovascular: Positive for leg swelling. Negative for chest pain.  Gastrointestinal: Negative for abdominal pain.  Genitourinary: Negative for dysuria.  Musculoskeletal: Positive for joint pain and falls.       2 falls   Skin: Negative for rash.  Neurological: Negative for dizziness and loss of consciousness.  Psychiatric/Behavioral: Negative for memory loss.      Past Medical History  Diagnosis Date  . Hypertension   . Abdominal pain   . Arthritis   . GERD (gastroesophageal reflux disease)   . Allergy   . Cataract     Bil  . Hyperlipidemia     no meds  . Abdominal pain, epigastric   . Nontoxic uninodular goiter   . Tobacco use disorder   . Abdominal tenderness, periumbilic   . Family history of diabetes mellitus   . Routine general medical examination at a health care facility   . Unspecified essential hypertension   . Pain in joint, site unspecified   . Lumbago     Past Surgical History  Procedure Laterality Date  . Cholecystectomy    . Abdominal hysterectomy    . Colonoscopy    . Cataracts      09/24/14,10/01/14    Social History:   reports that she has been smoking Cigarettes.  She has been  smoking about 0.50 packs per day. She has never used smokeless tobacco. She reports that she does not drink alcohol or use illicit drugs.  Family History  Problem Relation Age of Onset  . Colon cancer Neg Hx   . Esophageal cancer Neg Hx   . Rectal cancer Neg Hx   . Stomach cancer Neg Hx   . Cancer Mother     breast  . Diabetes Father   . Heart disease Father     Medications: Patient's Medications  New Prescriptions   No medications on file  Previous Medications   HYDROCHLOROTHIAZIDE (MICROZIDE) 12.5 MG CAPSULE    Take 12.5 mg by mouth daily.   Modified Medications   No medications on file  Discontinued Medications   DICLOFENAC SODIUM (VOLTAREN) 1 % GEL    Apply 4 g topically 4 (four) times daily.   HYOSCYAMINE  (LEVSIN SL) 0.125 MG SL TABLET    Take 1-2 tabs q4-6 hours prn.   OFLOXACIN (OCUFLOX) 0.3 % OPHTHALMIC SOLUTION       PREDNISOLONE ACETATE (PRED FORTE) 1 % OPHTHALMIC SUSPENSION       PROLENSA 0.07 % SOLN         Physical Exam: Filed Vitals:   10/30/14 1028  BP: 132/86  Pulse: 59  Temp: 97.9 F (36.6 C)  TempSrc: Oral  Resp: 20  Height: 5\' 7"  (1.702 m)  Weight: 228 lb 6.4 oz (103.602 kg)  SpO2: 99%  Physical Exam  Constitutional: She is oriented to person, place, and time.  Cardiovascular: Normal rate, regular rhythm, normal heart sounds and intact distal pulses.   Pulmonary/Chest: Effort normal and breath sounds normal. She has no wheezes. She has no rales.  Abdominal: Soft. Bowel sounds are normal.  Musculoskeletal: Normal range of motion. She exhibits tenderness.  Left knee swelling, tenderness; had great difficulty getting up out of chair after sitting; walks with limp  Neurological: She is alert and oriented to person, place, and time.  Skin: Skin is warm and dry.    Labs reviewed: Basic Metabolic Panel:  Recent Labs  07/28/14 1058  NA 144  K 5.6*  CL 102  CO2 26  GLUCOSE 99  BUN 13  CREATININE 0.74  CALCIUM 9.7  TSH 0.845   Liver Function Tests:  Recent Labs  07/28/14 1058  AST 9  ALT 6  ALKPHOS 93  BILITOT 0.7  PROT 6.3   No results for input(s): LIPASE, AMYLASE in the last 8760 hours. No results for input(s): AMMONIA in the last 8760 hours. CBC:  Recent Labs  07/28/14 1058  WBC 6.5  NEUTROABS 3.0  HGB 14.4  HCT 41.4  MCV 88  PLT 317   Lipid Panel: No results for input(s): CHOL, HDL, LDLCALC, TRIG, CHOLHDL, LDLDIRECT in the last 8760 hours. Lab Results  Component Value Date   HGBA1C 6.0* 07/28/2014    Assessment/Plan 1. Primary osteoarthritis of left knee - cont tylenol--discussed using up to 3000mg  per day max -discussed I could prescribe tramadol or hydrocodone, but she did not want this due to potential side effects - AMB  referral to orthopedics for possibly cartilage injections, more intensive imaging (had xrays only) and probably knee replacement  2. Hyperglycemia - f/u labs today, trying to eat properly and exercise, but knee pain interfering significantly - Hemoglobin A1c - Lipid panel  3. Neuropathic pain, leg, left -suspect this is due to back pain  4. Essential hypertension, benign -bp less than 140/90 -cont low dose hctz  5.  Hyperkalemia - discussed foods that cause high potassium - Basic metabolic panel  6. Multiple adenomatous polyps -agreed with genetic testing that GI recommended   Labs/tests ordered: Orders Placed This Encounter  Procedures  . Basic metabolic panel    Order Specific Question:  Has the patient fasted?    Answer:  Yes  . Hemoglobin A1c  . Lipid panel    Order Specific Question:  Has the patient fasted?    Answer:  Yes  . AMB referral to orthopedics    Referral Priority:  Routine    Referral Type:  Consultation    Number of Visits Requested:  1    Next appt:  3 months  Zebulon Gantt L. Dyane Broberg, D.O. [facility] [address], [place] [address] Cell Phone (Mon-Fri 8am-5pm):  [phone] On Call:  [phone] & follow prompts after 5pm & weekends Office Phone:  [phone] Office Fax:  [phone]

## 2014-10-31 LAB — BASIC METABOLIC PANEL
BUN/Creatinine Ratio: 14 (ref 11–26)
BUN: 10 mg/dL (ref 8–27)
CO2: 26 mmol/L (ref 18–29)
Calcium: 9.3 mg/dL (ref 8.7–10.3)
Chloride: 101 mmol/L (ref 97–108)
Creatinine, Ser: 0.7 mg/dL (ref 0.57–1.00)
GFR calc Af Amer: 105 mL/min/{1.73_m2} (ref >59)
GFR calc non Af Amer: 91 mL/min/{1.73_m2} (ref >59)
Glucose: 83 mg/dL (ref 65–99)
Potassium: 4.7 mmol/L (ref 3.5–5.2)
Sodium: 142 mmol/L (ref 134–144)

## 2014-10-31 LAB — LIPID PANEL
Chol/HDL Ratio: 5.4 ratio units — ABNORMAL HIGH (ref 0.0–4.4)
Cholesterol, Total: 254 mg/dL — ABNORMAL HIGH (ref 100–199)
HDL: 47 mg/dL (ref >39)
LDL Calculated: 184 mg/dL — ABNORMAL HIGH (ref 0–99)
Triglycerides: 117 mg/dL (ref 0–149)
VLDL Cholesterol Cal: 23 mg/dL (ref 5–40)

## 2014-10-31 LAB — HEMOGLOBIN A1C
Est. average glucose Bld gHb Est-mCnc: 131 mg/dL
Hgb A1c MFr Bld: 6.2 % — ABNORMAL HIGH (ref 4.8–5.6)

## 2014-11-03 ENCOUNTER — Other Ambulatory Visit: Payer: Self-pay

## 2014-11-03 MED ORDER — PRAVASTATIN SODIUM 10 MG PO TABS: 10.0000 mg | ORAL_TABLET | Freq: Every day | ORAL | Status: DC

## 2014-11-29 DIAGNOSIS — Z720 Tobacco use: Secondary | ICD-10-CM | POA: Diagnosis not present

## 2014-11-29 DIAGNOSIS — Z79899 Other long term (current) drug therapy: Secondary | ICD-10-CM | POA: Diagnosis not present

## 2014-11-29 DIAGNOSIS — E780 Pure hypercholesterolemia: Secondary | ICD-10-CM | POA: Diagnosis not present

## 2014-11-29 DIAGNOSIS — H1032 Unspecified acute conjunctivitis, left eye: Secondary | ICD-10-CM | POA: Diagnosis not present

## 2014-11-29 DIAGNOSIS — I10 Essential (primary) hypertension: Secondary | ICD-10-CM | POA: Diagnosis not present

## 2014-12-05 DIAGNOSIS — M1712 Unilateral primary osteoarthritis, left knee: Secondary | ICD-10-CM | POA: Diagnosis not present

## 2014-12-05 DIAGNOSIS — M25562 Pain in left knee: Secondary | ICD-10-CM | POA: Diagnosis not present

## 2015-01-21 DIAGNOSIS — M1712 Unilateral primary osteoarthritis, left knee: Secondary | ICD-10-CM | POA: Diagnosis not present

## 2015-01-27 DIAGNOSIS — M1712 Unilateral primary osteoarthritis, left knee: Secondary | ICD-10-CM | POA: Diagnosis not present

## 2015-02-04 DIAGNOSIS — M1712 Unilateral primary osteoarthritis, left knee: Secondary | ICD-10-CM | POA: Diagnosis not present

## 2015-02-23 ENCOUNTER — Encounter: Payer: Self-pay | Admitting: Internal Medicine

## 2015-02-23 ENCOUNTER — Ambulatory Visit (INDEPENDENT_AMBULATORY_CARE_PROVIDER_SITE_OTHER): Payer: Medicare Other | Admitting: Internal Medicine

## 2015-02-23 VITALS — BP 150/74 | HR 68 | Temp 98.5°F | Resp 20 | Ht 67.0 in | Wt 220.4 lb

## 2015-02-23 DIAGNOSIS — Z716 Tobacco abuse counseling: Secondary | ICD-10-CM | POA: Diagnosis not present

## 2015-02-23 DIAGNOSIS — E785 Hyperlipidemia, unspecified: Secondary | ICD-10-CM

## 2015-02-23 DIAGNOSIS — I10 Essential (primary) hypertension: Secondary | ICD-10-CM

## 2015-02-23 DIAGNOSIS — Z720 Tobacco use: Secondary | ICD-10-CM

## 2015-02-23 DIAGNOSIS — Z961 Presence of intraocular lens: Secondary | ICD-10-CM | POA: Diagnosis not present

## 2015-02-23 DIAGNOSIS — R1033 Periumbilical pain: Secondary | ICD-10-CM

## 2015-02-23 DIAGNOSIS — Z23 Encounter for immunization: Secondary | ICD-10-CM

## 2015-02-23 DIAGNOSIS — M170 Bilateral primary osteoarthritis of knee: Secondary | ICD-10-CM | POA: Diagnosis not present

## 2015-02-23 DIAGNOSIS — R739 Hyperglycemia, unspecified: Secondary | ICD-10-CM | POA: Diagnosis not present

## 2015-02-23 MED ORDER — BD ASSURE BPM/AUTO ARM CUFF MISC: 1.0000 | Freq: Every day | Status: DC

## 2015-02-23 MED ORDER — VARENICLINE TARTRATE 0.5 MG X 11 & 1 MG X 42 PO MISC: ORAL | Status: DC

## 2015-02-23 NOTE — Progress Notes (Addendum)
Patient ID: Rachel York, female   DOB: [birth year], 64 y.o.   MRN: [id number]   Location:  [facility]  Goals of Care: Advanced Directive information Does patient have an advance directive?: No, Would patient like information on creating an advanced directive?: Yes - Educational materials given (again reminded her to complete this today)   Chief Complaint  Patient presents with  . Medical Management of Chronic Issues    HPI: Patient is a 64 y.o. black female seen in the office today for med mgt of chronic diseases.    Had knee gel injections in left knee by [facility].  Says she grunts instead of screams in pain.  Follows up on 8/25.  Now right knee hurting.  Has not been taking NSAIDs.    Exercising has not been happening due to knee.  Wants to get back to walking around the neighborhood.  Did lose 8 lbs since last visit.  Eats less than she did.  Eating a lot of fresh fruits and veggies.  Says her tummy bothers her 1-2 x per week.  Had gallbladder out 11-12 yrs ago and this began after that.  Does not get diarrhea but does get abdominal pain after some meals.  Has tried otc indigestion meds and does not help.  Sleeps and hopes it's gone when she wakes up.  No clear food correlation.  Sometimes even when wakes up it hurts.  Stays around belly button.  H pylori test negative. cscope negative for cause.    Is getting all teeth extracted next week with plans for dentures.  Wants to quit smoking.  Is in need of $60 per month and it's about $15/wk to smoke.    Took hctz today, but bp was up slightly.  Sprint study she was participating in is done.  Pts did better with lower systolic bps 606-301.    Some days between 1-4pm, she gets overwhelmingly fatigued.  Sometimes dizzy and seeing double.  Other times just weak.  Wondering if bp spikes in daytime.  Has increased her water intake.  Feels better if naps, but then cannot sleep at night. Does  drink coffee in am.  Also smokes before that.   Does not have bp cuff at home.  Has f/u with Dr. Gershon Crane today.  Had cataract surgery.  Left eye still not great--had pink eye and sensation like something in it.  Right did fine.   Review of Systems:  Review of Systems  Constitutional: Positive for weight loss and malaise/fatigue. Negative for fever and chills.  HENT: Negative for hearing loss.   Eyes:       S/p cataract surgery  Respiratory: Negative for shortness of breath.        Still smokes but does want to quit  Cardiovascular: Negative for chest pain and leg swelling.  Gastrointestinal: Positive for abdominal pain and diarrhea.  Genitourinary: Negative for dysuria, urgency and frequency.  Musculoskeletal: Positive for joint pain. Negative for falls.  Neurological: Negative for dizziness and headaches.  Psychiatric/Behavioral: Negative for depression and memory loss. The patient is not nervous/anxious and does not have insomnia.     Past Medical History  Diagnosis Date  . Hypertension   . Abdominal pain   . Arthritis   . GERD (gastroesophageal reflux disease)   . Allergy   . Cataract     Bil  . Hyperlipidemia     no meds  . Abdominal pain, epigastric   . Nontoxic  uninodular goiter   . Tobacco use disorder   . Abdominal tenderness, periumbilic   . Family history of diabetes mellitus   . Routine general medical examination at a health care facility   . Unspecified essential hypertension   . Pain in joint, site unspecified   . Lumbago     Past Surgical History  Procedure Laterality Date  . Cholecystectomy    . Abdominal hysterectomy    . Colonoscopy    . Cataracts      09/24/14,10/01/14    No Known Allergies Medications: Patient's Medications  New Prescriptions   BLOOD PRESSURE MONITORING (B-D ASSURE BPM/AUTO ARM CUFF) MISC    1 Device by Does not apply route daily.   VARENICLINE (CHANTIX STARTING MONTH PAK) 0.5 MG X 11 & 1 MG X 42 TABLET    Take one 0.5 mg tablet  by mouth once daily for 3 days, then increase to one 0.5 mg tablet twice daily for 4 days, then increase to one 1 mg tablet twice daily.  Previous Medications   HYDROCHLOROTHIAZIDE (MICROZIDE) 12.5 MG CAPSULE    Take 12.5 mg by mouth daily.    PRAVASTATIN (PRAVACHOL) 10 MG TABLET    Take 1 tablet (10 mg total) by mouth daily.  Modified Medications   No medications on file  Discontinued Medications   No medications on file    Physical Exam: Filed Vitals:   02/23/15 1057 02/23/15 1203  BP: 150/72 150/74  Pulse: 68   Temp: 98.5 F (36.9 C)   TempSrc: Oral   Resp: 20   Height: 5\' 7"  (1.702 m)   Weight: 220 lb 6.4 oz (99.973 kg)   SpO2: 97%    Physical Exam  Constitutional: She is oriented to person, place, and time. She appears well-developed and well-nourished. No distress.  Cardiovascular: Normal rate, regular rhythm, normal heart sounds and intact distal pulses.   Pulmonary/Chest: Effort normal and breath sounds normal. No respiratory distress.  Abdominal: Soft. Bowel sounds are normal. She exhibits no distension and no mass. There is no tenderness. There is no rebound and no guarding. No hernia.  Musculoskeletal: Normal range of motion.  Neurological: She is alert and oriented to person, place, and time.  Skin: Skin is warm and dry.  Psychiatric: She has a normal mood and affect.    Labs reviewed: Basic Metabolic Panel:  Recent Labs  07/28/14 1058 10/30/14 1138 02/23/15 1209  NA 144 142 142  K 5.6* 4.7 4.5  CL 102 101 102  CO2 26 26 26   GLUCOSE 99 83 91  BUN 13 10 11   CREATININE 0.74 0.70 0.72  CALCIUM 9.7 9.3 9.5  TSH 0.845  --   --    Liver Function Tests:  Recent Labs  07/28/14 1058 02/23/15 1209  AST 9 10  ALT 6 5  ALKPHOS 93 95  BILITOT 0.7 0.7  PROT 6.3 6.3   No results for input(s): LIPASE, AMYLASE in the last 8760 hours. No results for input(s): AMMONIA in the last 8760 hours. CBC:  Recent Labs  07/28/14 1058 02/23/15 1209  WBC 6.5 5.6    NEUTROABS 3.0 2.2  HGB 14.4  --   HCT 41.4 42.5  MCV 88  --   PLT 317  --    Lipid Panel:  Recent Labs  10/30/14 1138 02/23/15 1209  CHOL 254* 264*  HDL 47 44  LDLCALC 184* 195*  TRIG 117 123  CHOLHDL 5.4* 6.0*   Lab Results  Component Value Date  HGBA1C 6.0* 02/23/2015   Assessment/Plan 1. Periumbilical abdominal pain - unclear cause -workup has been unremarkable previously including cscope, but she did not have an EGD--? Upper part of small bowel involved -recommended that if labs are normal here today that she go back to Dr. Hilarie Fredrickson for further evaluation - Basic metabolic panel - Hepatic Function Panel  2. Primary osteoarthritis of both knees -going pain, is not ready to have surgery yet b/c she is relatively young -rarely takes any tylenol for it -encourage regular exercise to keep joints lubricated  3. Hyperglycemia - has lost a few lbs so hopefully sugar average has improved - Hemoglobin A1c  4. Essential hypertension, benign -bp may be elevated at home when she has smoked in middle of the day and feels so bad -cuff Rx provided so she can check her bp when she is feeling weak and tired--record them and let me know - Blood Pressure Monitoring (B-D ASSURE BPM/AUTO ARM CUFF) MISC; 1 Device by Does not apply route daily.  Dispense: 1 each; Refill: 0 - CBC with Differential/Platelet - Basic metabolic panel  5. Encounter for smoking cessation counseling -she is interested in quitting and feels ready -she has a date in mind -prescribed chantix--given sample starter pack -f/u in 6 wks to see how she is doing  6. Hyperlipidemia - cont dietary changes and pravachol - reassess labs: - Lipid panel - Hepatic Function Panel  7.  Need for prevnar:   -prevnar 13 was given today  Labs/tests ordered: Orders Placed This Encounter  Procedures  . Pneumococcal conjugate vaccine 13-valent  . CBC with Differential/Platelet  . Basic metabolic panel    Order Specific  Question:  Has the patient fasted?    Answer:  Yes  . Hemoglobin A1c  . Lipid panel    Order Specific Question:  Has the patient fasted?    Answer:  Yes  . Hepatic Function Panel    Next appt:  6 wks re: smoking cessation  Rachel York, D.O. [facility] [address],  [address] Cell Phone (Mon-Fri 8am-5pm):  [phone] On Call:  [phone] & follow prompts after 5pm & weekends Office Phone:  [phone] Office Fax:  [phone]

## 2015-02-24 LAB — CBC WITH DIFFERENTIAL/PLATELET
Basophils Absolute: 0.1 10*3/uL (ref 0.0–0.2)
Basos: 1 %
EOS (ABSOLUTE): 0.1 10*3/uL (ref 0.0–0.4)
Eos: 2 %
Hematocrit: 42.5 % (ref 34.0–46.6)
Hemoglobin: 14 g/dL (ref 11.1–15.9)
Immature Grans (Abs): 0 10*3/uL (ref 0.0–0.1)
Immature Granulocytes: 0 %
Lymphocytes Absolute: 2.8 10*3/uL (ref 0.7–3.1)
Lymphs: 50 %
MCH: 29 pg (ref 26.6–33.0)
MCHC: 32.9 g/dL (ref 31.5–35.7)
MCV: 88 fL (ref 79–97)
Monocytes Absolute: 0.4 10*3/uL (ref 0.1–0.9)
Monocytes: 7 %
Neutrophils Absolute: 2.2 10*3/uL (ref 1.4–7.0)
Neutrophils: 40 %
Platelets: 272 10*3/uL (ref 150–379)
RBC: 4.83 x10E6/uL (ref 3.77–5.28)
RDW: 14 % (ref 12.3–15.4)
WBC: 5.6 10*3/uL (ref 3.4–10.8)

## 2015-02-24 LAB — BASIC METABOLIC PANEL
BUN/Creatinine Ratio: 15 (ref 11–26)
BUN: 11 mg/dL (ref 8–27)
CO2: 26 mmol/L (ref 18–29)
Calcium: 9.5 mg/dL (ref 8.7–10.3)
Chloride: 102 mmol/L (ref 97–108)
Creatinine, Ser: 0.72 mg/dL (ref 0.57–1.00)
GFR calc Af Amer: 102 mL/min/{1.73_m2} (ref >59)
GFR calc non Af Amer: 88 mL/min/{1.73_m2} (ref >59)
Glucose: 91 mg/dL (ref 65–99)
Potassium: 4.5 mmol/L (ref 3.5–5.2)
Sodium: 142 mmol/L (ref 134–144)

## 2015-02-24 LAB — HEMOGLOBIN A1C
Est. average glucose Bld gHb Est-mCnc: 126 mg/dL
Hgb A1c MFr Bld: 6 % — ABNORMAL HIGH (ref 4.8–5.6)

## 2015-02-24 LAB — LIPID PANEL
Chol/HDL Ratio: 6 ratio units — ABNORMAL HIGH (ref 0.0–4.4)
Cholesterol, Total: 264 mg/dL — ABNORMAL HIGH (ref 100–199)
HDL: 44 mg/dL (ref >39)
LDL Calculated: 195 mg/dL — ABNORMAL HIGH (ref 0–99)
Triglycerides: 123 mg/dL (ref 0–149)
VLDL Cholesterol Cal: 25 mg/dL (ref 5–40)

## 2015-02-24 LAB — HEPATIC FUNCTION PANEL
ALT: 5 IU/L (ref 0–32)
AST: 10 IU/L (ref 0–40)
Albumin: 3.8 g/dL (ref 3.6–4.8)
Alkaline Phosphatase: 95 IU/L (ref 39–117)
Bilirubin Total: 0.7 mg/dL (ref 0.0–1.2)
Bilirubin, Direct: 0.16 mg/dL (ref 0.00–0.40)
Total Protein: 6.3 g/dL (ref 6.0–8.5)

## 2015-03-19 DIAGNOSIS — M1712 Unilateral primary osteoarthritis, left knee: Secondary | ICD-10-CM | POA: Diagnosis not present

## 2015-03-31 HISTORY — PX: MOUTH SURGERY: SHX715

## 2015-04-06 ENCOUNTER — Ambulatory Visit (INDEPENDENT_AMBULATORY_CARE_PROVIDER_SITE_OTHER): Payer: Medicare Other | Admitting: Internal Medicine

## 2015-04-06 ENCOUNTER — Encounter: Payer: Self-pay | Admitting: Internal Medicine

## 2015-04-06 VITALS — BP 138/80 | HR 56 | Temp 98.2°F | Resp 16 | Ht 67.0 in | Wt 219.0 lb

## 2015-04-06 DIAGNOSIS — K08109 Complete loss of teeth, unspecified cause, unspecified class: Secondary | ICD-10-CM

## 2015-04-06 DIAGNOSIS — K000 Anodontia: Secondary | ICD-10-CM | POA: Diagnosis not present

## 2015-04-06 DIAGNOSIS — R739 Hyperglycemia, unspecified: Secondary | ICD-10-CM | POA: Diagnosis not present

## 2015-04-06 DIAGNOSIS — Z716 Tobacco abuse counseling: Secondary | ICD-10-CM | POA: Diagnosis not present

## 2015-04-06 DIAGNOSIS — I10 Essential (primary) hypertension: Secondary | ICD-10-CM

## 2015-04-06 DIAGNOSIS — Z720 Tobacco use: Secondary | ICD-10-CM | POA: Diagnosis not present

## 2015-04-06 MED ORDER — BD ASSURE BPM/AUTO ARM CUFF MISC: 1.0000 | Freq: Every day | Status: DC

## 2015-04-06 NOTE — Patient Instructions (Addendum)
Please get BP cuff and check blood pressure and your pulse daily.   If you get dizzy in the afternoon, check both blood pressure and pulse.  Notify us if your HR is under 50 beats per minute.    Continue to cut back on cigarettes.

## 2015-04-06 NOTE — Progress Notes (Signed)
Patient ID: Rachel York, female   DOB: 1950/12/08, 64 y.o.   MRN: [id number]   Location:  [facility]  Goals of Care: Advanced Directive information Does patient have an advance directive?: No, Would patient like information on creating an advanced directive?: Yes - Educational materials given   Chief Complaint  Patient presents with  . Medical Management of Chronic Issues    6 week follow-up on smoking cessation   . Immunizations    Patient refused any immunizations today, will consider at a later date.    HPI: Patient is a 64 y.o. black female seen in the office today for f/u on smoking cessation.    Dental extraction 8/8 not bad, but grinding bone afterwards 9/6ish was painful even under sedation and she swelled up badly.  Has stitches all the way around top and bottom.  They are to dissolve.  Knots are getting caught on roof of mouth.  Some coming out and bothering her.   Has care credit and spent so much on the bone grinding so now will be hard to afford the teeth part.  Follows up on 9/29.  Has not yet quit smoking.  Has cut back to 5 cigarettes per day, max 10 per day.  Had been doing a pack a day.  Chantix made her nauseous so stopped after the first week and 3 days of two a day (made her feel bad at the twice a day).  Is not buying cigarettes--using partner's.  Is not taking them in the car. Has to get up to get them.  With the mouth stuff, smoking is not as high priority.    Has not gotten bp cuff.  She forgot.    Says at home, she's down 5 lbs.  Had liver and onions last night.  Otherwise having pureed or soft sandwich starting in the middle, spaghetti/pasta.  Using hydrocodone and ibuprofen from dentist--using less than the 4x per day prescribed.  Mouth will be very tight--relaxes her mouth--takes 1-2x per day. Did take for the first 2 days.  Admits to dizziness like head spinning usually in the afternoons like 1-4pm.  Discussed  checking her pulse when she has symptoms and reporting if under 50.  Review of Systems:  Review of Systems  Constitutional: Negative for fever, chills and malaise/fatigue.  HENT: Negative for congestion.        Just had teeth extracted  Eyes: Negative for blurred vision.  Respiratory: Negative for cough and shortness of breath.        Still smoking but less  Cardiovascular: Negative for chest pain.  Gastrointestinal: Negative for abdominal pain, constipation, blood in stool and melena.  Genitourinary: Negative for dysuria, urgency and frequency.  Musculoskeletal: Positive for joint pain. Negative for falls.  Skin: Negative for rash.  Neurological: Positive for dizziness. Negative for weakness.  Endo/Heme/Allergies: Does not bruise/bleed easily.  Psychiatric/Behavioral: Negative for depression and memory loss.    Past Medical History  Diagnosis Date  . Hypertension   . Abdominal pain   . Arthritis   . GERD (gastroesophageal reflux disease)   . Allergy   . Cataract     Bil  . Hyperlipidemia     no meds  . Abdominal pain, epigastric   . Nontoxic uninodular goiter   . Tobacco use disorder   . Abdominal tenderness, periumbilic   . Family history of diabetes mellitus   . Routine general medical examination at a health care facility   .  Unspecified essential hypertension   . Pain in joint, site unspecified   . Lumbago     Past Surgical History  Procedure Laterality Date  . Cholecystectomy    . Abdominal hysterectomy    . Colonoscopy    . Cataracts      09/24/14,10/01/14  . Mouth surgery  03/31/2015    No Known Allergies Medications: Patient's Medications  New Prescriptions   No medications on file  Previous Medications   CHLORHEXIDINE (PERIDEX) 0.12 % SOLUTION    Use as directed 15 mLs in the mouth or throat 2 (two) times daily.   HYDROCHLOROTHIAZIDE (MICROZIDE) 12.5 MG CAPSULE    Take 12.5 mg by mouth daily.    HYDROCODONE-ACETAMINOPHEN (NORCO/VICODIN) 5-325 MG PER  TABLET    Take 1 tablet by mouth every 6 (six) hours as needed for moderate pain.   IBUPROFEN (ADVIL,MOTRIN) 600 MG TABLET    Take 600 mg by mouth every 6 (six) hours as needed.   PENICILLIN V POTASSIUM (VEETID) 500 MG TABLET    Take 500 mg by mouth 4 (four) times daily.   PRAVASTATIN (PRAVACHOL) 10 MG TABLET    Take 1 tablet (10 mg total) by mouth daily.   VARENICLINE (CHANTIX STARTING MONTH PAK) 0.5 MG X 11 & 1 MG X 42 TABLET    Take one 0.5 mg tablet by mouth once daily for 3 days, then increase to one 0.5 mg tablet twice daily for 4 days, then increase to one 1 mg tablet twice daily.  Modified Medications   Modified Medication Previous Medication   BLOOD PRESSURE MONITORING (B-D ASSURE BPM/AUTO ARM CUFF) MISC Blood Pressure Monitoring (B-D ASSURE BPM/AUTO ARM CUFF) MISC      1 Device by Does not apply route daily.    1 Device by Does not apply route daily.  Discontinued Medications   No medications on file    Physical Exam: Filed Vitals:   04/06/15 1317  BP: 138/80  Pulse: 56  Temp: 98.2 F (36.8 C)  TempSrc: Oral  Resp: 16  Height: 5' 7" (1.702 m)  Weight: 219 lb (99.338 kg)  SpO2: 97%   Physical Exam  Constitutional: She is oriented to person, place, and time. She appears well-developed and well-nourished. No distress.  HENT:  Head: Normocephalic and atraumatic.  Currently edentulous  Cardiovascular: Normal rate, regular rhythm, normal heart sounds and intact distal pulses.   Pulmonary/Chest: Effort normal and breath sounds normal. No respiratory distress. She has no wheezes.  Abdominal: Soft. Bowel sounds are normal.  Musculoskeletal: Normal range of motion. She exhibits tenderness.  knees  Neurological: She is alert and oriented to person, place, and time.  Skin: Skin is warm and dry.  Psychiatric: She has a normal mood and affect.    Labs reviewed: Basic Metabolic Panel:  Recent Labs  07/28/14 1058 10/30/14 1138 02/23/15 1209  NA 144 142 142  K 5.6* 4.7 4.5    CL 102 101 102  CO2 _0 GLUCOSE 99 83 91  BUN _1 CREATININE 0.74 0.70 0.72  CALCIUM 9.7 9.3 9.5  TSH 0.845  --   --    Liver Function Tests:  Recent Labs  07/28/14 1058 02/23/15 1209  AST 9 10  ALT 6 5  ALKPHOS 93 95  BILITOT 0.7 0.7  PROT 6.3 6.3   No results for input(s): LIPASE, AMYLASE in the last 8760 hours. No results for input(s): AMMONIA in the last 8760 hours. CBC:  Recent Labs  07/28/14 1058 02/23/15 1209  WBC 6.5 5.6  NEUTROABS 3.0 2.2  HGB 14.4  --   HCT 41.4 42.5  MCV 88  --   PLT 317  --    Lipid Panel:  Recent Labs  10/30/14 1138 02/23/15 1209  CHOL 254* 264*  HDL 47 44  LDLCALC 184* 195*  TRIG 117 123  CHOLHDL 5.4* 6.0*   Lab Results  Component Value Date   HGBA1C 6.0* 02/23/2015   Assessment/Plan 1. Encounter for smoking cessation counseling -pt has cut back on cigarettes, but is now smoking her partner's cigarettes instead of buying them as a way to save money instead of quitting to save money -unless he quits, too, or refuses to let her have them, we have still not overcome this barrier  2. Essential hypertension, benign -bp ok today -did not get cuff last time as recommended--advised to check bp and pulse when she has those dizzy spells and a couple times a week to see how it is outside of the office--will review record when she returns - Blood Pressure Monitoring (B-D ASSURE BPM/AUTO ARM CUFF) MISC; 1 Device by Does not apply route daily.  Dispense: 1 each; Refill: 0 - Basic metabolic panel; Future  3. Hyperglycemia - encouraged increased exercise and dietary changes--does seem to eat pretty well already and did lose quite a bit of weight before I met her that she does not want to put back on - Hemoglobin A1c; Future - Lipid panel; Future - Basic metabolic panel; Future - CBC with Differential/Platelet; Future  4. Mouth, edentulous -still has absorbable sutures in place -has reduced her hydrocodone use and  doing pretty well with this -diet is limited by no teeth at present--discussed smoothies with fruits and veggies b/c it sounds like she is eating more simple carbs right now than she should and not exercising enough to burn off  Labs/tests ordered: Orders Placed This Encounter  Procedures  . Hemoglobin A1c    Standing Status: Future     Number of Occurrences:      Standing Expiration Date: 08/06/2015  . Lipid panel    Standing Status: Future     Number of Occurrences:      Standing Expiration Date: 08/06/2015    Order Specific Question:  Has the patient fasted?    Answer:  Yes  . Basic metabolic panel    Standing Status: Future     Number of Occurrences:      Standing Expiration Date: 08/06/2015    Order Specific Question:  Has the patient fasted?    Answer:  Yes  . CBC with Differential/Platelet    Standing Status: Future     Number of Occurrences:      Standing Expiration Date: 08/06/2015    Next appt:  F/u in 2 mos with labs before  Stella Bortle L. Errika Narvaiz, D.O. [facility] [address], [place] [address] Cell Phone (Mon-Fri 8am-5pm):  [phone] On Call:  [phone] & follow prompts after 5pm & weekends Office Phone:  [phone] Office Fax:  [phone]

## 2015-05-23 ENCOUNTER — Other Ambulatory Visit: Payer: Self-pay | Admitting: Internal Medicine

## 2015-05-25 ENCOUNTER — Other Ambulatory Visit: Payer: Self-pay

## 2015-05-25 MED ORDER — PRAVASTATIN SODIUM 20 MG PO TABS: 20.0000 mg | ORAL_TABLET | Freq: Every day | ORAL | Status: DC

## 2015-06-04 ENCOUNTER — Other Ambulatory Visit: Payer: Medicare Other

## 2015-06-08 ENCOUNTER — Ambulatory Visit: Payer: Medicare Other | Admitting: Internal Medicine

## 2015-06-19 IMAGING — US US BREAST LTD UNI RIGHT INC AXILLA
1 series · 13 of 13 positions shown · non-contrast
Comparison: Prior mammograms dated 10/03/2012 and 10/19/2012.

ACR Breast Density Category a: The breast tissue is almost entirely
fatty.

CLINICAL DATA: Patient had a diagnostic exam on 10/19/2012 where a
probable right benign nodule and axillary lymph node was visualized.
The patient did not return for the short-term interval followup.

EXAM:
DIGITAL DIAGNOSTIC BILATERAL MAMMOGRAM WITH CAD
ULTRASOUND RIGHT BREAST

[Series 1: advbreast · 13 of 13 slices shown]
[im 1/13]
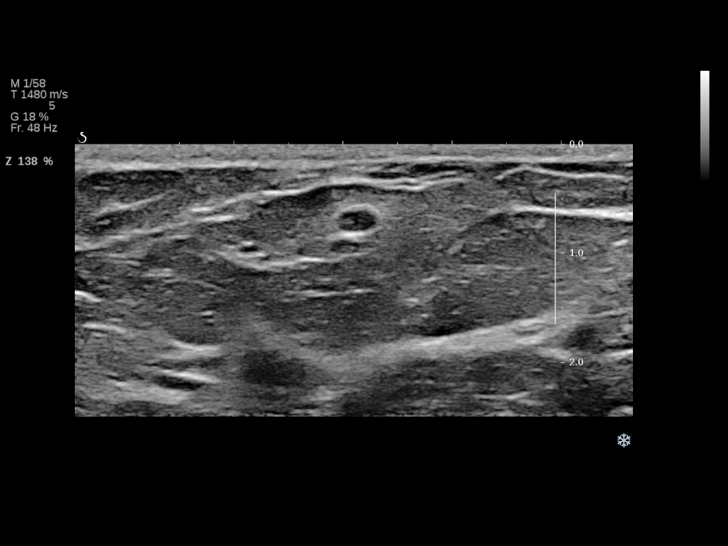
[im 2/13]
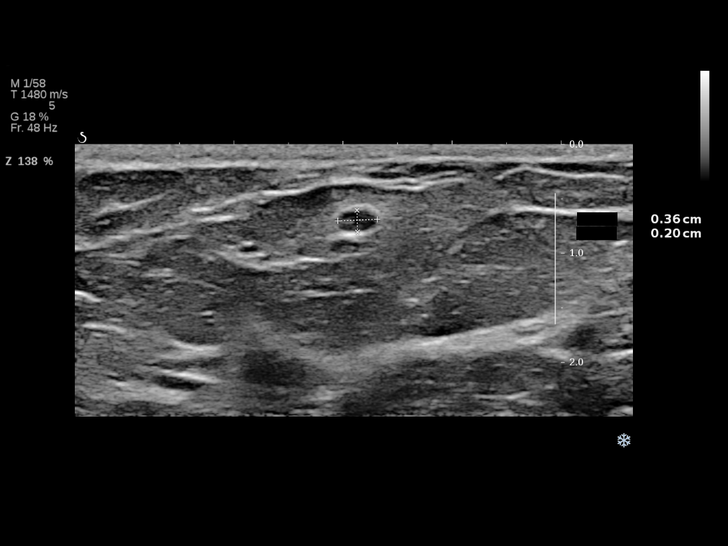
[im 3/13]
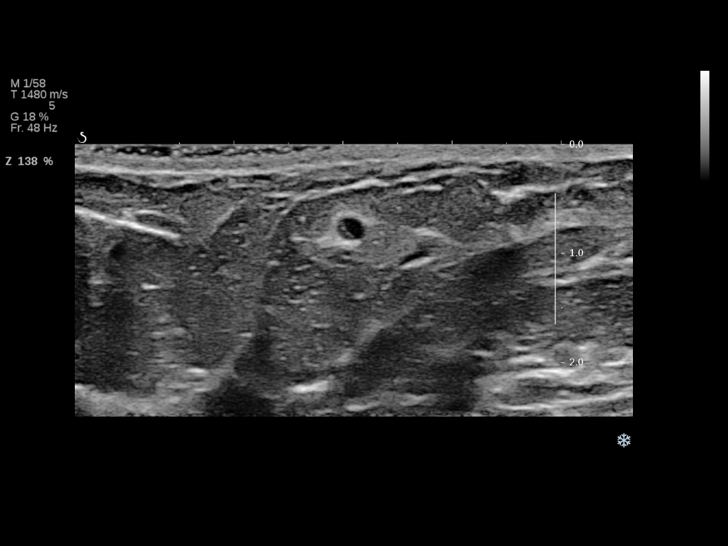
[im 4/13]
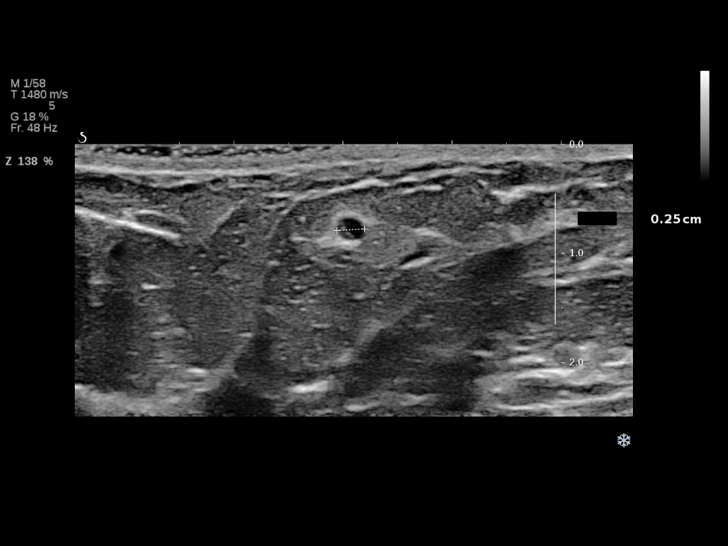
[im 5/13]
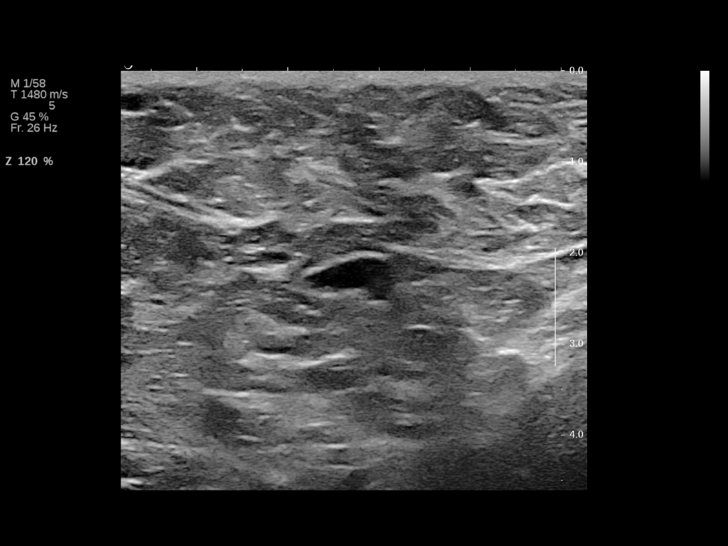
[im 6/13]
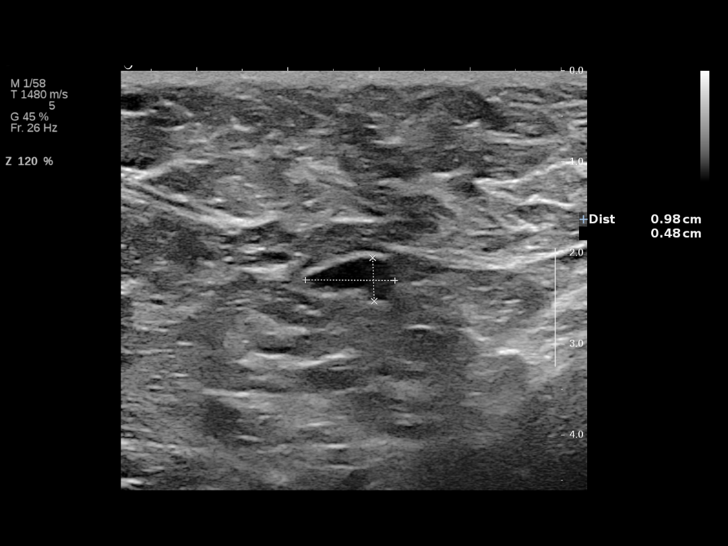
[im 7/13]
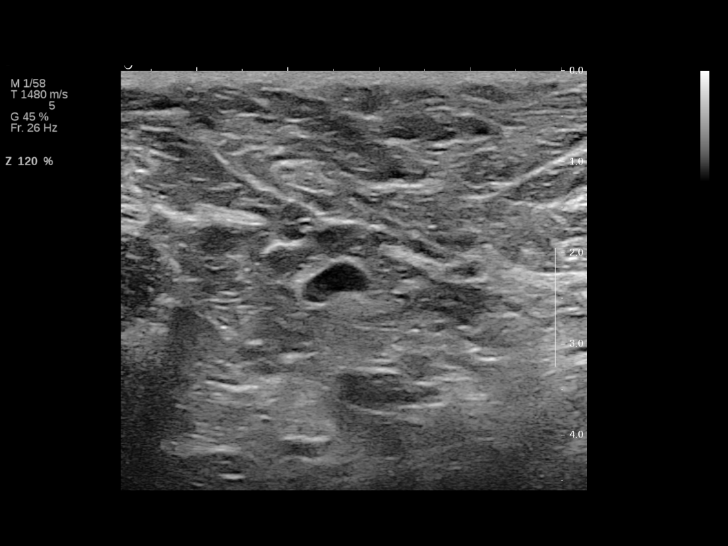
[im 8/13]
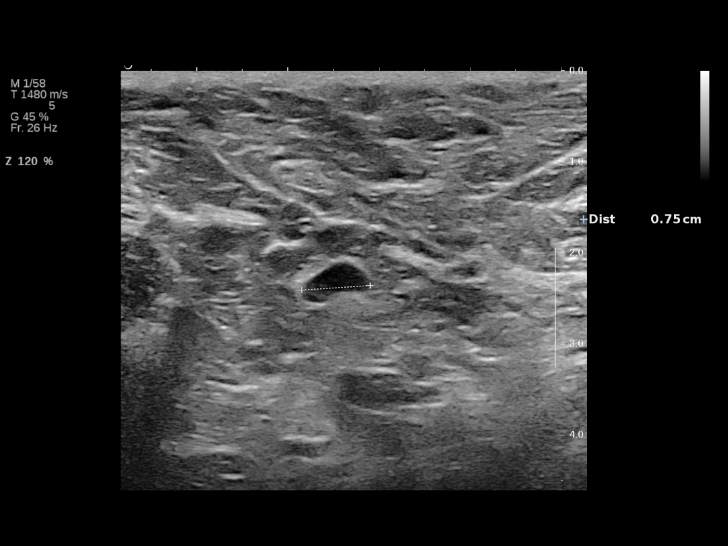
[im 9/13]
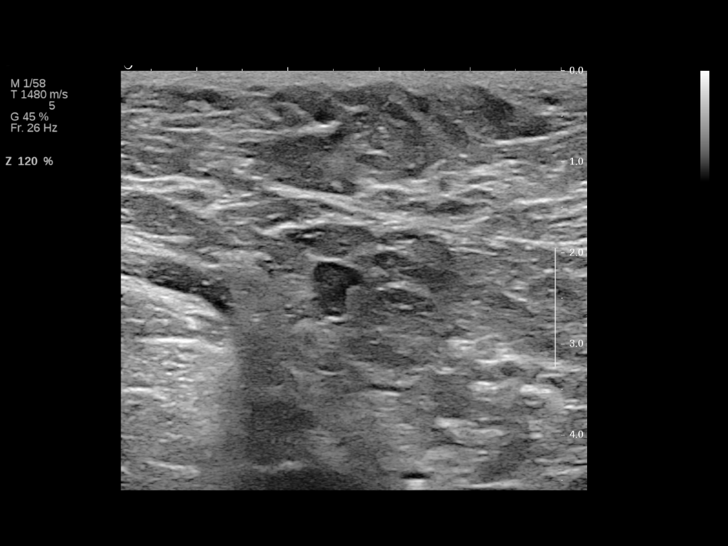
[im 10/13]
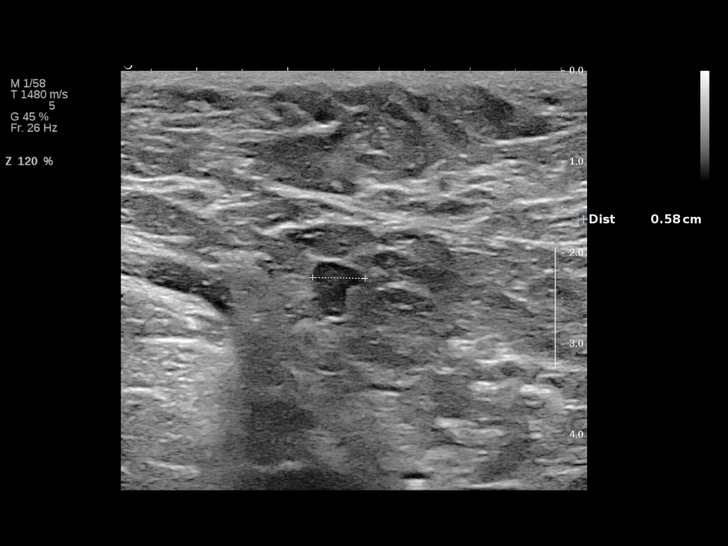
[im 11/13]
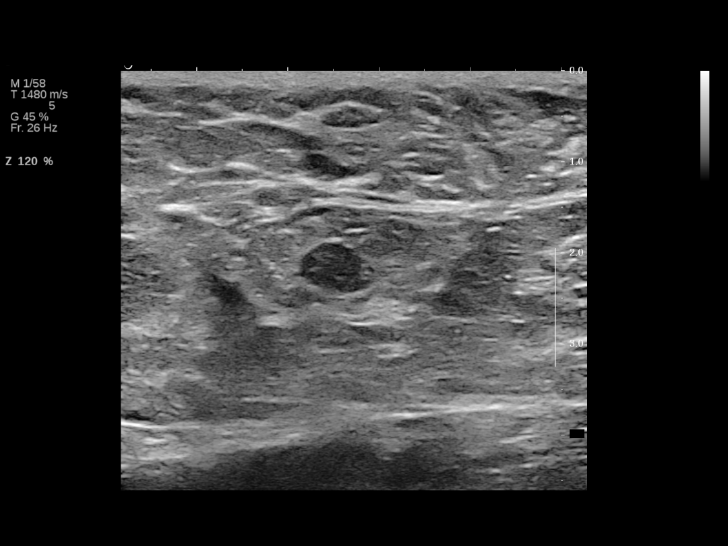
[im 12/13]
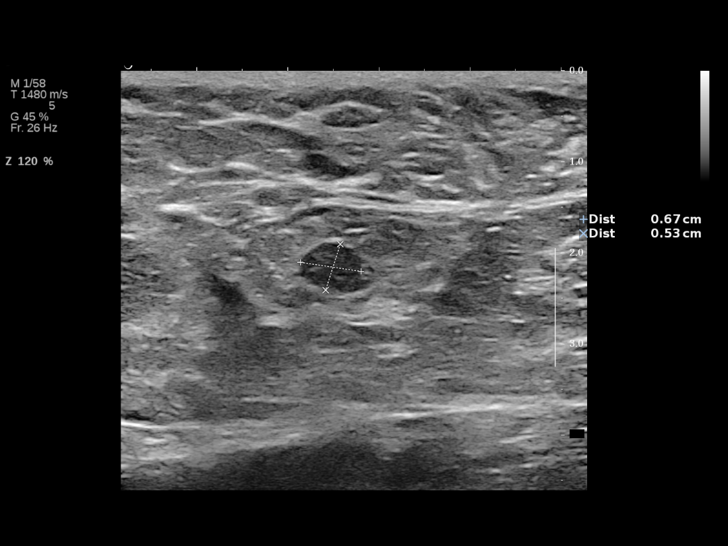
[im 13/13]
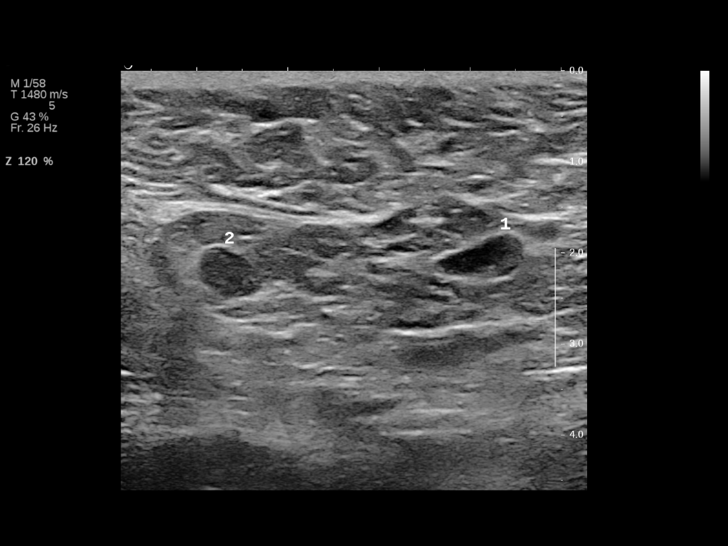

[13 of 13 positions shown; findings below may reference images not displayed]

FINDINGS: There is a stable 4 mm low-density nodule in the 9 o'clock region of
the right breast. Two adjacent lymph nodes are seen in the right
axilla. The more inferior lymph node was the 1 previously visualized
and is stable in size. No suspicious mass or malignant type
microcalcifications seen in the left breast.

Mammographic images were processed with CAD.

On physical exam, I do not palpate a mass in the 9 o'clock region of
the right breast or in the right axilla.

Targeted ultrasound is performed, showing a stable 4 x 2 x 3 mm
well-circumscribed hypoechoic nodule in the right breast at 9
o'clock 4 cm from the nipple. It has a benign appearance. There are
2 adjacent benign-appearing lymph nodes in the right axilla
measuring 10 x 5 x 8 mm and 7 x 5 x 6 mm.
IMPRESSION: No evidence of malignancy in either breast.

RECOMMENDATION:
Bilateral screening mammogram in 1 year is recommended.

I have discussed the findings and recommendations with the patient.
Results were also provided in writing at the conclusion of the
visit. If applicable, a reminder letter will be sent to the patient
regarding the next appointment.

BI-RADS CATEGORY  2: Benign.

## 2015-06-25 ENCOUNTER — Ambulatory Visit (INDEPENDENT_AMBULATORY_CARE_PROVIDER_SITE_OTHER): Payer: Medicare Other

## 2015-06-25 ENCOUNTER — Other Ambulatory Visit: Payer: Medicare Other

## 2015-06-25 DIAGNOSIS — R739 Hyperglycemia, unspecified: Secondary | ICD-10-CM | POA: Diagnosis not present

## 2015-06-25 DIAGNOSIS — I10 Essential (primary) hypertension: Secondary | ICD-10-CM | POA: Diagnosis not present

## 2015-06-25 DIAGNOSIS — Z23 Encounter for immunization: Secondary | ICD-10-CM | POA: Diagnosis not present

## 2015-06-25 MED ORDER — ZOSTER VACCINE LIVE 19400 UNT/0.65ML ~~LOC~~ SOLR: 0.6500 mL | Freq: Once | SUBCUTANEOUS | Status: DC

## 2015-06-25 MED ORDER — PRAVASTATIN SODIUM 20 MG PO TABS: 20.0000 mg | ORAL_TABLET | Freq: Every day | ORAL | Status: DC

## 2015-06-26 LAB — BASIC METABOLIC PANEL
BUN/Creatinine Ratio: 17 (ref 11–26)
BUN: 12 mg/dL (ref 8–27)
CO2: 26 mmol/L (ref 18–29)
Calcium: 9.7 mg/dL (ref 8.7–10.3)
Chloride: 100 mmol/L (ref 97–106)
Creatinine, Ser: 0.7 mg/dL (ref 0.57–1.00)
GFR calc Af Amer: 105 mL/min/{1.73_m2} (ref >59)
GFR calc non Af Amer: 91 mL/min/{1.73_m2} (ref >59)
Glucose: 99 mg/dL (ref 65–99)
Potassium: 4.2 mmol/L (ref 3.5–5.2)
Sodium: 141 mmol/L (ref 136–144)

## 2015-06-26 LAB — LIPID PANEL
Chol/HDL Ratio: 6 ratio units — ABNORMAL HIGH (ref 0.0–4.4)
Cholesterol, Total: 265 mg/dL — ABNORMAL HIGH (ref 100–199)
HDL: 44 mg/dL (ref >39)
LDL Calculated: 200 mg/dL — ABNORMAL HIGH (ref 0–99)
Triglycerides: 105 mg/dL (ref 0–149)
VLDL Cholesterol Cal: 21 mg/dL (ref 5–40)

## 2015-06-26 LAB — CBC WITH DIFFERENTIAL/PLATELET
Basophils Absolute: 0 10*3/uL (ref 0.0–0.2)
Basos: 0 %
EOS (ABSOLUTE): 0.2 10*3/uL (ref 0.0–0.4)
Eos: 2 %
Hematocrit: 40.7 % (ref 34.0–46.6)
Hemoglobin: 14.1 g/dL (ref 11.1–15.9)
Immature Grans (Abs): 0 10*3/uL (ref 0.0–0.1)
Immature Granulocytes: 1 %
Lymphocytes Absolute: 2.8 10*3/uL (ref 0.7–3.1)
Lymphs: 44 %
MCH: 30 pg (ref 26.6–33.0)
MCHC: 34.6 g/dL (ref 31.5–35.7)
MCV: 87 fL (ref 79–97)
Monocytes Absolute: 0.5 10*3/uL (ref 0.1–0.9)
Monocytes: 9 %
Neutrophils Absolute: 2.7 10*3/uL (ref 1.4–7.0)
Neutrophils: 44 %
Platelets: 302 10*3/uL (ref 150–379)
RBC: 4.7 x10E6/uL (ref 3.77–5.28)
RDW: 14.4 % (ref 12.3–15.4)
WBC: 6.3 10*3/uL (ref 3.4–10.8)

## 2015-06-26 LAB — HEMOGLOBIN A1C
Est. average glucose Bld gHb Est-mCnc: 126 mg/dL
Hgb A1c MFr Bld: 6 % — ABNORMAL HIGH (ref 4.8–5.6)

## 2015-06-29 ENCOUNTER — Ambulatory Visit (INDEPENDENT_AMBULATORY_CARE_PROVIDER_SITE_OTHER): Payer: Medicare Other | Admitting: Internal Medicine

## 2015-06-29 ENCOUNTER — Encounter: Payer: Self-pay | Admitting: Internal Medicine

## 2015-06-29 VITALS — BP 150/88 | HR 60 | Temp 98.2°F | Wt 217.0 lb

## 2015-06-29 DIAGNOSIS — E785 Hyperlipidemia, unspecified: Secondary | ICD-10-CM

## 2015-06-29 DIAGNOSIS — I10 Essential (primary) hypertension: Secondary | ICD-10-CM | POA: Diagnosis not present

## 2015-06-29 DIAGNOSIS — F17200 Nicotine dependence, unspecified, uncomplicated: Secondary | ICD-10-CM

## 2015-06-29 DIAGNOSIS — Z720 Tobacco use: Secondary | ICD-10-CM

## 2015-06-29 DIAGNOSIS — R739 Hyperglycemia, unspecified: Secondary | ICD-10-CM

## 2015-06-29 DIAGNOSIS — Z716 Tobacco abuse counseling: Secondary | ICD-10-CM

## 2015-06-29 NOTE — Progress Notes (Signed)
Patient ID: Rachel York, female   DOB: Jun 09, 1951, 64 y.o.   MRN: [id number]   Location: [place] Provider: Rexene Edison. Mariea Clonts, D.O., C.M.D.  Goals of Care: Advanced Directive information Does patient have an advance directive?: Yes, Type of Advance Directive: [place];Living will  Chief Complaint  Patient presents with  . Medical Management of Chronic Issues    blood pressure, hyperglycermia    HPI: Patient is a 64 y.o. female seen in the office today for med mgt of chronic diseases.    HTN:  BP elevated this am.  Does not yet have bp meter due to cost.  Is going to pharmacy to check.  Tobacco abuse:   Still smoking, but not with dentures in.  Smokes a lot less than she used to.  Did have a cigarette with her coffee this am.    Hyperglycemia:  Says she is going to get the hba1c down.  Is at 57 and has been stable there.    Got her new teeth:  Doesn't like them.  Feels like she has a mouth full of plastic with her dentures.  Had them adjusted once.  Is to f/u in feb.  Doesn't wear them much.  Doesn't feel her mouth closes naturally.  Using adhesive pads.  Can't eat with them in.    Down 2 lbs here vs. Three months ago.    Hyperlipidemia:  pravachol 20mg .  Has been out for a while due to lack of refills and getting run around with pharmacy.  Review of Systems:  Review of Systems  Constitutional: Negative for fever, chills and malaise/fatigue.  HENT: Negative for hearing loss.        New dentures  Eyes: Negative for blurred vision.  Respiratory: Negative for shortness of breath.   Cardiovascular: Negative for chest pain and leg swelling.  Gastrointestinal: Negative for constipation, blood in stool and melena.  Genitourinary: Negative for dysuria.  Musculoskeletal: Positive for joint pain. Negative for myalgias and falls.  Skin: Negative for rash.  Neurological: Negative for dizziness, loss of consciousness and weakness.  Endo/Heme/Allergies: Does  not bruise/bleed easily.  Psychiatric/Behavioral: Negative for depression and memory loss.    Past Medical History  Diagnosis Date  . Hypertension   . Abdominal pain   . Arthritis   . GERD (gastroesophageal reflux disease)   . Allergy   . Cataract     Bil  . Hyperlipidemia     no meds  . Abdominal pain, epigastric   . Nontoxic uninodular goiter   . Tobacco use disorder   . Abdominal tenderness, periumbilic   . Family history of diabetes mellitus   . Routine general medical examination at a health care facility   . Unspecified essential hypertension   . Pain in joint, site unspecified   . Lumbago     Past Surgical History  Procedure Laterality Date  . Cholecystectomy    . Abdominal hysterectomy    . Colonoscopy    . Cataracts      09/24/14,10/01/14  . Mouth surgery  03/31/2015    No Known Allergies    Medication List       This list is accurate as of: 06/29/15 10:37 AM.  Always use your most recent med list.               hydrochlorothiazide 12.5 MG capsule  Commonly known as:  MICROZIDE  Take 12.5 mg by mouth daily.     pravastatin 20 MG tablet  Commonly known as:  PRAVACHOL  Take 1 tablet (20 mg total) by mouth daily. For High Cholesterol     zoster vaccine live (PF) 19400 UNT/0.65ML injection  Commonly known as:  ZOSTAVAX  Inject 19,400 Units into the skin once.        Health Maintenance  Topic Date Due  . Hepatitis C Screening  09-29-1950  . HIV Screening  08/20/1964  . PAP SMEAR  08/20/1970  . ZOSTAVAX  07/26/2015 (Originally 08/20/2009)  . TETANUS/TDAP  07/26/2015 (Originally 08/20/1968)  . COLONOSCOPY  10/07/2015  . INFLUENZA VACCINE  02/23/2016  . PNA vac Low Risk Adult (2 of 2 - PPSV23) 02/23/2016  . MAMMOGRAM  08/25/2016  . DEXA SCAN  Completed    Physical Exam: Filed Vitals:   06/29/15 1025  BP: 150/88  Pulse: 60  Temp: 98.2 F (36.8 C)  TempSrc: Oral  Weight: 217 lb (98.431 kg)  SpO2: 97%   Body mass index is 33.98  kg/(m^2). Physical Exam  Constitutional: She is oriented to person, place, and time. She appears well-developed and well-nourished. No distress.  Cardiovascular: Normal rate, regular rhythm, normal heart sounds and intact distal pulses.   Pulmonary/Chest: Effort normal and breath sounds normal. No respiratory distress.  Abdominal: Soft. Bowel sounds are normal. She exhibits no distension. There is no tenderness.  Musculoskeletal: Normal range of motion. She exhibits tenderness.  Right knee  Neurological: She is alert and oriented to person, place, and time.  Skin: Skin is warm and dry.  Psychiatric: She has a normal mood and affect.    Labs reviewed: Basic Metabolic Panel:  Recent Labs  07/28/14 1058 10/30/14 1138 02/23/15 1209 06/25/15 1101  NA 144 142 142 141  K 5.6* 4.7 4.5 4.2  CL 102 101 102 100  CO2 26 26 26 26   GLUCOSE 99 83 91 99  BUN 13 10 11 12   CREATININE 0.74 0.70 0.72 0.70  CALCIUM 9.7 9.3 9.5 9.7  TSH 0.845  --   --   --    Liver Function Tests:  Recent Labs  07/28/14 1058 02/23/15 1209  AST 9 10  ALT 6 5  ALKPHOS 93 95  BILITOT 0.7 0.7  PROT 6.3 6.3  ALBUMIN 4.0 3.8   No results for input(s): LIPASE, AMYLASE in the last 8760 hours. No results for input(s): AMMONIA in the last 8760 hours. CBC:  Recent Labs  07/28/14 1058 02/23/15 1209 06/25/15 1101  WBC 6.5 5.6 6.3  NEUTROABS 3.0 2.2 2.7  HGB 14.4  --   --   HCT 41.4 42.5 40.7  MCV 88  --   --   PLT 317  --   --    Lipid Panel:  Recent Labs  10/30/14 1138 02/23/15 1209 06/25/15 1101  CHOL 254* 264* 265*  HDL 47 44 44  LDLCALC 184* 195* 200*  TRIG 117 123 105  CHOLHDL 5.4* 6.0* 6.0*   Lab Results  Component Value Date   HGBA1C 6.0* 06/25/2015    Assessment/Plan 1. Essential hypertension, benign -bp is elevated this am -she has smoked and had coffee this am - advised to check bp weekly at pharmacy and record - if next two are also over 150, will increase medication/add  medication -if not, cont to monitor and bring results to next visit - Basic metabolic panel; Future  2. Hyperglycemia - f/u lab, work on diet some more and exercise - Hemoglobin A1c; Future  3. Hyperlipidemia -increase exercise and get back on pravachol she'd  run out of -if lipids still not at goal (doubt they will be with goal <70), will increase dose next time - Lipid panel; Future  4. Encounter for smoking cessation counseling -more than 3 mins spent discussing smoking cessation with pt -she has tried wellbutrin and has cut down on cigarettes, previously quit, but has been unable so far this time -discussed risks of ongoing use, benefits of quitting, means of quitting, but she's not ready  5. Tobacco use disorder -ongoing despite new dentures and cost benefits of quitting--cut back enough to save money, but stopped cutting back when she had money to spare   Labs/tests ordered:   Orders Placed This Encounter  Procedures  . Basic metabolic panel    Standing Status: Future     Number of Occurrences:      Standing Expiration Date: 12/28/2015    Order Specific Question:  Has the patient fasted?    Answer:  Yes  . Hemoglobin A1c    Standing Status: Future     Number of Occurrences:      Standing Expiration Date: 12/28/2015  . Lipid panel    Standing Status: Future     Number of Occurrences:      Standing Expiration Date: 12/28/2015    Order Specific Question:  Has the patient fasted?    Answer:  Yes    Next appt:  3 mos with labs before  Shyane Fossum L. Chidinma Clites, D.O. [facility] [address], [place] [address] Cell Phone (Mon-Fri 8am-5pm):  [phone] On Call:  [phone] & follow prompts after 5pm & weekends Office Phone:  [phone] Office Fax:  [phone]

## 2015-06-29 NOTE — Patient Instructions (Addendum)
Check your bp weekly and bring a record of it.    Cut back on your fatty foods, butter, fried and increase your exercise.  Get back on the pravachol.  Merry Christmas!

## 2015-08-31 ENCOUNTER — Other Ambulatory Visit: Payer: Self-pay | Admitting: *Deleted

## 2015-08-31 MED ORDER — HYDROCHLOROTHIAZIDE 12.5 MG PO CAPS: 12.5000 mg | ORAL_CAPSULE | Freq: Every day | ORAL | Status: DC

## 2015-08-31 MED ORDER — PRAVASTATIN SODIUM 20 MG PO TABS: 20.0000 mg | ORAL_TABLET | Freq: Every day | ORAL | Status: DC

## 2015-08-31 NOTE — Telephone Encounter (Signed)
Patient requested to be sent to [facility]

## 2015-09-21 ENCOUNTER — Other Ambulatory Visit: Payer: Self-pay | Admitting: *Deleted

## 2015-09-21 MED ORDER — PRAVASTATIN SODIUM 20 MG PO TABS: 20.0000 mg | ORAL_TABLET | Freq: Every day | ORAL | Status: DC

## 2015-09-21 NOTE — Telephone Encounter (Signed)
[facility] [phone]

## 2015-09-24 ENCOUNTER — Encounter: Payer: Self-pay | Admitting: Internal Medicine

## 2015-10-07 ENCOUNTER — Other Ambulatory Visit: Payer: Medicare Other

## 2015-10-08 ENCOUNTER — Encounter: Payer: Medicare Other | Admitting: Internal Medicine

## 2015-10-16 ENCOUNTER — Other Ambulatory Visit: Payer: Medicare Other

## 2015-10-19 ENCOUNTER — Encounter: Payer: Medicare Other | Admitting: Internal Medicine

## 2015-10-26 ENCOUNTER — Other Ambulatory Visit: Payer: Medicare Other

## 2015-10-27 DIAGNOSIS — I10 Essential (primary) hypertension: Secondary | ICD-10-CM | POA: Diagnosis not present

## 2015-10-27 DIAGNOSIS — E785 Hyperlipidemia, unspecified: Secondary | ICD-10-CM | POA: Diagnosis not present

## 2015-10-27 DIAGNOSIS — R739 Hyperglycemia, unspecified: Secondary | ICD-10-CM | POA: Diagnosis not present

## 2015-10-27 LAB — LIPID PANEL
CHOLESTEROL: 227 mg/dL — AB (ref 0–200)
HDL: 56 mg/dL (ref 35–70)
LDL Cholesterol: 150 mg/dL
TRIGLYCERIDES: 107 mg/dL (ref 40–160)

## 2015-10-27 LAB — BASIC METABOLIC PANEL
BUN: 14 mg/dL (ref 4–21)
CREATININE: 0.8 mg/dL (ref 0.5–1.1)
Glucose: 112 mg/dL
POTASSIUM: 4.5 mmol/L (ref 3.4–5.3)
Sodium: 144 mmol/L (ref 137–147)

## 2015-10-27 LAB — HEMOGLOBIN A1C: HEMOGLOBIN A1C: 6

## 2015-10-29 ENCOUNTER — Encounter: Payer: Self-pay | Admitting: Internal Medicine

## 2015-10-29 ENCOUNTER — Encounter: Payer: Self-pay | Admitting: *Deleted

## 2015-10-29 ENCOUNTER — Ambulatory Visit (INDEPENDENT_AMBULATORY_CARE_PROVIDER_SITE_OTHER): Payer: Medicare Other | Admitting: Internal Medicine

## 2015-10-29 VITALS — BP 160/78 | HR 58 | Temp 97.9°F | Resp 20 | Ht 67.0 in | Wt 220.6 lb

## 2015-10-29 DIAGNOSIS — Z0000 Encounter for general adult medical examination without abnormal findings: Secondary | ICD-10-CM

## 2015-10-29 DIAGNOSIS — M170 Bilateral primary osteoarthritis of knee: Secondary | ICD-10-CM | POA: Diagnosis not present

## 2015-10-29 DIAGNOSIS — F17200 Nicotine dependence, unspecified, uncomplicated: Secondary | ICD-10-CM | POA: Diagnosis not present

## 2015-10-29 DIAGNOSIS — E049 Nontoxic goiter, unspecified: Secondary | ICD-10-CM

## 2015-10-29 DIAGNOSIS — E785 Hyperlipidemia, unspecified: Secondary | ICD-10-CM | POA: Diagnosis not present

## 2015-10-29 DIAGNOSIS — I10 Essential (primary) hypertension: Secondary | ICD-10-CM | POA: Diagnosis not present

## 2015-10-29 DIAGNOSIS — R739 Hyperglycemia, unspecified: Secondary | ICD-10-CM | POA: Diagnosis not present

## 2015-10-29 MED ORDER — PRAVASTATIN SODIUM 40 MG PO TABS: 40.0000 mg | ORAL_TABLET | Freq: Every day | ORAL | Status: DC

## 2015-10-29 NOTE — Patient Instructions (Signed)
Increase pravachol to 40mg  from 20mg   I will order an ultrasound of your thyroid next time due to the goiter

## 2015-10-29 NOTE — Progress Notes (Signed)
Patient ID: Rachel York, female   DOB: 21-Apr-1951, 65 y.o.   MRN: [id number]   Location:  [facility] clinic Provider: Aleaha Fickling L. Mariea Clonts, D.O., C.M.D.  Patient Care Team: Gayland Curry, DO as PCP - General (Geriatric Medicine) Jerene Bears, MD as Consulting Physician (Gastroenterology)  Extended Emergency Contact Information Primary Emergency Contact: Thomasene Lot of [place] Mobile Phone: [phone] Relation: Daughter  Goals of Care: Advanced Directive information Advanced Directives 10/29/2015  Does patient have an advance directive? Yes  Type of Advance Directive [place]  Does patient want to make changes to advanced directive? No - Patient declined  Copy of advanced directive(s) in chart? Yes   Chief Complaint  Patient presents with  . Annual Exam    MMSE, EKG done  . Medical Management of Chronic Issues    HPI: Patient is a 65 y.o. female seen in today for an annual wellness exam and med mgt of chronic diseases.    Depression screen [facility] 2/9 10/29/2015 10/30/2014  Decreased Interest 2 0  Down, Depressed, Hopeless 2 0  PHQ - 2 Score 4 0  Altered sleeping 3 -  Tired, decreased energy 3 -  Change in appetite 3 -  Feeling bad or failure about yourself  1 -  Trouble concentrating 1 -  Moving slowly or fidgety/restless 0 -  Suicidal thoughts 0 -  PHQ-9 Score 15 -  Children have issues.  Partner is about to have surgery for a trigeminal neuralgia.    Is in pain all the time and doesn't like it.  Both knees, back, hip.  Stomach still hurts when it gets ready.  It's getting old.  Tired of cortisone shots not working for long.  Does not want to see orthopedics about her knees.  If sits too long, she can't just jump up.  Moving is slow a lot of times.  Walks with a cart in the grocery store, but if she does too much, her left hip starts to hurt and sometimes her lower back.    Got a whole new set of teeth, but only wears them for a few hours at a  time.  New set does fit much better.    Smoking:  No worse, but no better.  Fall Risk  10/29/2015 02/23/2015 10/30/2014  Falls in the past year? No No Yes  Number falls in past yr: - - 2 or more  Injury with Fall? - - No   MMSE - Mini Mental State Exam 10/29/2015  Not completed: (No Data)  Orientation to time 4  Orientation to Place 5  Registration 3  Attention/ Calculation 5  Recall 3  Language- name 2 objects 2  Language- repeat 1  Language- follow 3 step command 3  Language- read & follow direction 1  Write a sentence 1  Copy design 1  Total score 29   Health Maintenance  Topic Date Due  . Hepatitis C Screening  [birth year]  . TETANUS/TDAP  08/20/1968  . ZOSTAVAX  08/20/2009  . COLONOSCOPY  10/07/2015  . INFLUENZA VACCINE  02/23/2016  . PNA vac Low Risk Adult (2 of 2 - PPSV23) 02/23/2016  . MAMMOGRAM  08/25/2016  . DEXA SCAN  Completed   Urinary incontinence?  Some functional difficulty due to her knee and back pain.   Functional Status Survey: Is the patient deaf or have difficulty hearing?: No Does the patient have difficulty seeing, even when wearing glasses/contacts?: No Does the patient have difficulty  concentrating, remembering, or making decisions?: No Does the patient have difficulty walking or climbing stairs?: No Does the patient have difficulty dressing or bathing?: No Does the patient have difficulty doing errands alone such as visiting a doctor's office or shopping?: No Exercise?  Does walk until something starts to hurt.  Likes to walk, but afraid to b/c if it's too painful, then what.   Diet?  Just doesn't care about this right now.  Did get a steamer and a wok and sets the one in the other and makes great veggies with that. No exam data present Left eye had infection in it.  That one didn't do as well as the right.  Has had three infections total since cataract removed.   Hearing:  well Dentition:  Has another new set of dentures, but still not wearing  them Pain:  See above  Past Medical History  Diagnosis Date  . Hypertension   . Abdominal pain   . Arthritis   . GERD (gastroesophageal reflux disease)   . Allergy   . Cataract     Bil  . Hyperlipidemia     no meds  . Abdominal pain, epigastric   . Nontoxic uninodular goiter   . Tobacco use disorder   . Abdominal tenderness, periumbilic   . Family history of diabetes mellitus   . Routine general medical examination at a health care facility   . Unspecified essential hypertension   . Pain in joint, site unspecified   . Lumbago     Past Surgical History  Procedure Laterality Date  . Cholecystectomy    . Abdominal hysterectomy    . Colonoscopy    . Cataracts      09/24/14,10/01/14  . Mouth surgery  03/31/2015    Social History   Social History  . Marital Status: Single    Spouse Name: N/A  . Number of Children: N/A  . Years of Education: N/A   Occupational History  . Retired    Social History Main Topics  . Smoking status: Current Every Day Smoker -- 0.25 packs/day    Types: Cigarettes  . Smokeless tobacco: Never Used     Comment: less than 1/2 pack as og 04/06/15  . Alcohol Use: No  . Drug Use: No  . Sexual Activity:    Partners: Male   Other Topics Concern  . Not on file   Social History Narrative    No Known Allergies    Medication List       This list is accurate as of: 10/29/15  2:39 PM.  Always use your most recent med list.               hydrochlorothiazide 12.5 MG capsule  Commonly known as:  MICROZIDE  Take 1 capsule (12.5 mg total) by mouth daily.     pravastatin 40 MG tablet  Commonly known as:  PRAVACHOL  Take 1 tablet (40 mg total) by mouth daily. For High Cholesterol         Review of Systems:  Review of Systems  Constitutional: Negative for fever, chills and malaise/fatigue.  HENT: Negative for congestion.   Eyes: Negative for blurred vision.  Respiratory: Negative for shortness of breath.   Cardiovascular: Negative for  chest pain and leg swelling.  Gastrointestinal: Negative for abdominal pain, constipation, blood in stool and melena.  Genitourinary: Positive for urgency. Negative for dysuria, frequency and hematuria.  Musculoskeletal: Positive for myalgias, back pain and joint pain. Negative for falls.  Skin: Negative for rash.  Neurological: Negative for dizziness, loss of consciousness and weakness.  Endo/Heme/Allergies: Does not bruise/bleed easily.  Psychiatric/Behavioral: Positive for depression. Negative for memory loss. The patient is not nervous/anxious.        Says some difficulty with word finding    Physical Exam: Filed Vitals:   10/29/15 1345  BP: 150/78  Pulse: 58  Temp: 97.9 F (36.6 C)  TempSrc: Oral  Resp: 20  Height: 5\' 7"  (1.702 m)  Weight: 220 lb 9.6 oz (100.064 kg)  SpO2: 96%   Body mass index is 34.54 kg/(m^2). Physical Exam  Constitutional: She is oriented to person, place, and time. She appears well-developed and well-nourished. No distress.  HENT:  Head: Normocephalic and atraumatic.  Right Ear: External ear normal.  Left Ear: External ear normal.  Nose: Nose normal.  Mouth/Throat: Oropharynx is clear and moist. No oropharyngeal exudate.  edentulous  Eyes: Conjunctivae and EOM are normal. Pupils are equal, round, and reactive to light.  Neck: Neck supple. No JVD present. Thyromegaly present.  Cardiovascular: Normal rate, regular rhythm, normal heart sounds and intact distal pulses.   No murmur heard. Pulmonary/Chest: Effort normal and breath sounds normal. No respiratory distress. Right breast exhibits no inverted nipple, no mass, no nipple discharge, no skin change and no tenderness. Left breast exhibits no inverted nipple, no mass, no nipple discharge, no skin change and no tenderness.  Abdominal: Soft. Bowel sounds are normal. She exhibits no distension and no mass. There is no tenderness.  Musculoskeletal: Normal range of motion. She exhibits tenderness.   Lymphadenopathy:    She has no cervical adenopathy.    She has no axillary adenopathy.       Right: No supraclavicular adenopathy present.       Left: No supraclavicular adenopathy present.  Neurological: She is alert and oriented to person, place, and time. She has normal reflexes. No cranial nerve deficit.  Skin: Skin is warm and dry.  Psychiatric: She has a normal mood and affect.    Labs reviewed: Basic Metabolic Panel:  Recent Labs  10/30/14 1138 02/23/15 1209 06/25/15 1101 10/27/15  NA 142 142 141 144  K 4.7 4.5 4.2 4.5  CL 101 102 100  --   CO2 26 26 26   --   GLUCOSE 83 91 99  --   BUN 10 11 12 14   CREATININE 0.70 0.72 0.70 0.8  CALCIUM 9.3 9.5 9.7  --    Liver Function Tests:  Recent Labs  02/23/15 1209  AST 10  ALT 5  ALKPHOS 95  BILITOT 0.7  PROT 6.3  ALBUMIN 3.8   No results for input(s): LIPASE, AMYLASE in the last 8760 hours. No results for input(s): AMMONIA in the last 8760 hours. CBC:  Recent Labs  02/23/15 1209 06/25/15 1101  WBC 5.6 6.3  NEUTROABS 2.2 2.7  HCT 42.5 40.7  MCV 88 87  PLT 272 302   Lipid Panel:  Recent Labs  10/30/14 1138 02/23/15 1209 06/25/15 1101 10/27/15  CHOL 254* 264* 265* 227*  HDL 47 44 44 56  LDLCALC 184* 195* 200* 150  TRIG 117 123 105 107  CHOLHDL 5.4* 6.0* 6.0*  --    Lab Results  Component Value Date   HGBA1C 6.0 10/27/2015   EKG today:  NSR at 53bpm, no acute ischemia or infarct  Assessment/Plan 1. Medicare annual wellness visit, subsequent -see hpi  2. Primary osteoarthritis of both knees - is not ready to go for surgery yet,  so agrees to some PT first as injections are no longer giving her relief - Ambulatory referral to Physical Therapy  3. Essential hypertension, benign -bp elevated today -is still smoking -continues on hctz 12.5mg  -if bp still elevated on next visit, will clearly need to add more medication  4. Hyperglycemia -cont to work on diet and exercise regimen -f/u hba1c    5. Hyperlipidemia -increase pravachol - pravastatin (PRAVACHOL) 40 MG tablet; Take 1 tablet (40 mg total) by mouth daily. For High Cholesterol  Dispense: 30 tablet; Refill: 3  6. Tobacco use disorder -continues, not ready to quit  7.  Thyroid goiter -noted on exam today  Labs/tests ordered:     Orders Placed This Encounter  Procedures  . CBC with Differential/Platelet    Standing Status: Future     Number of Occurrences:      Standing Expiration Date: 06/29/2016  . Basic metabolic panel    Standing Status: Future     Number of Occurrences:      Standing Expiration Date: 06/29/2016    Order Specific Question:  Has the patient fasted?    Answer:  Yes  . TSH    Standing Status: Future     Number of Occurrences:      Standing Expiration Date: 06/29/2016  . Lipid panel    Standing Status: Future     Number of Occurrences:      Standing Expiration Date: 06/29/2016    Order Specific Question:  Has the patient fasted?    Answer:  Yes  . Hemoglobin A1c    Standing Status: Future     Number of Occurrences:      Standing Expiration Date: 06/29/2016  . Ambulatory referral to Physical Therapy    Referral Priority:  Routine    Referral Type:  Physical Medicine    Referral Reason:  Specialty Services Required    Requested Specialty:  Physical Therapy    Number of Visits Requested:  1    Next appt:  4 mos, med mgt, thyroid  Mckade Gurka L. Carri Spillers, D.O. [facility] [address], [place] [address] Cell Phone (Mon-Fri 8am-5pm):  [phone] On Call:  [phone] & follow prompts after 5pm & weekends Office Phone:  [phone] Office Fax:  [phone]

## 2015-11-03 ENCOUNTER — Encounter: Payer: Self-pay | Admitting: *Deleted

## 2015-11-07 ENCOUNTER — Encounter: Payer: Self-pay | Admitting: Internal Medicine

## 2015-11-18 ENCOUNTER — Ambulatory Visit: Payer: Medicare Other | Admitting: Physical Therapy

## 2015-12-07 ENCOUNTER — Other Ambulatory Visit: Payer: Self-pay | Admitting: *Deleted

## 2015-12-07 MED ORDER — HYDROCHLOROTHIAZIDE 12.5 MG PO CAPS: 12.5000 mg | ORAL_CAPSULE | Freq: Every day | ORAL | Status: DC

## 2015-12-07 NOTE — Telephone Encounter (Signed)
[facility] [employer].

## 2016-03-04 ENCOUNTER — Ambulatory Visit (INDEPENDENT_AMBULATORY_CARE_PROVIDER_SITE_OTHER): Payer: Medicare Other | Admitting: Internal Medicine

## 2016-03-04 ENCOUNTER — Encounter: Payer: Self-pay | Admitting: Internal Medicine

## 2016-03-04 VITALS — BP 138/70 | HR 59 | Temp 98.2°F | Ht 67.0 in | Wt 219.0 lb

## 2016-03-04 DIAGNOSIS — M170 Bilateral primary osteoarthritis of knee: Secondary | ICD-10-CM

## 2016-03-04 DIAGNOSIS — R739 Hyperglycemia, unspecified: Secondary | ICD-10-CM | POA: Diagnosis not present

## 2016-03-04 DIAGNOSIS — E785 Hyperlipidemia, unspecified: Secondary | ICD-10-CM | POA: Diagnosis not present

## 2016-03-04 DIAGNOSIS — F17200 Nicotine dependence, unspecified, uncomplicated: Secondary | ICD-10-CM

## 2016-03-04 DIAGNOSIS — Z23 Encounter for immunization: Secondary | ICD-10-CM | POA: Diagnosis not present

## 2016-03-04 DIAGNOSIS — I10 Essential (primary) hypertension: Secondary | ICD-10-CM | POA: Diagnosis not present

## 2016-03-04 MED ORDER — HYDROCHLOROTHIAZIDE 12.5 MG PO CAPS: 12.5000 mg | ORAL_CAPSULE | Freq: Every day | ORAL | 3 refills | Status: DC

## 2016-03-04 MED ORDER — PRAVASTATIN SODIUM 40 MG PO TABS: 40.0000 mg | ORAL_TABLET | Freq: Every day | ORAL | 3 refills | Status: DC

## 2016-03-04 NOTE — Progress Notes (Signed)
Location:  [facility] clinic Provider:  Hodges Treiber L. Mariea Clonts, D.O., C.M.D.  Goals of Care:  Advanced Directives 03/04/2016  Does patient have an advance directive? Yes  Type of Paramedic of [place];Living will  Does patient want to make changes to advanced directive? -  Copy of advanced directive(s) in chart? Yes  Would patient like information on creating an advanced directive? -     Chief Complaint  Patient presents with  . Medical Management of Chronic Issues    70mth follow-up    HPI: Patient is a 65 y.o. female seen today for medical management of chronic diseases.    Son living with her and daughter homeless.  She needs to go back to work.  Now she has 4 dogs in the house.  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    Is smoking more and enjoying it less.  It will get better when she can't smoke during the day b/c of working.  Arthritis in knees stable lately.  Still going fishing, but only on the flat side in [facility].    BP ok today.  HR 59.  Continues on hctz.    Hyperlipidemia:  Continues on pravachol.    Agrees to pneumovax today.  Past Medical History:  Diagnosis Date  . Abdominal pain   . Abdominal pain, epigastric   . Abdominal tenderness, periumbilic   . Allergy   . Arthritis   . Cataract    Bil  . Family history of diabetes mellitus   . GERD (gastroesophageal reflux disease)   . Hyperlipidemia    no meds  . Hypertension   . Lumbago   . Nontoxic uninodular goiter   . Pain in joint, site unspecified   . Routine general medical examination at a health care facility   . Tobacco use disorder   . Unspecified essential hypertension     Past Surgical History:  Procedure Laterality Date  . ABDOMINAL HYSTERECTOMY    . CATARACTS     09/24/14,10/01/14  . CHOLECYSTECTOMY    . COLONOSCOPY    . MOUTH SURGERY  03/31/2015    No Known Allergies    Medication List       Accurate as of 03/04/16 10:57 AM. Always use  your most recent med list.          hydrochlorothiazide 12.5 MG capsule Commonly known as:  MICROZIDE Take 1 capsule (12.5 mg total) by mouth daily.   pravastatin 40 MG tablet Commonly known as:  PRAVACHOL Take 1 tablet (40 mg total) by mouth daily. For High Cholesterol       Review of Systems:  Review of Systems  Constitutional: Negative for chills, fever and malaise/fatigue.  HENT: Negative for hearing loss.        Edentulous and does not wear her new dentures  Eyes: Negative for blurred vision.  Respiratory: Negative for cough and shortness of breath.   Cardiovascular: Negative for chest pain, palpitations and leg swelling.  Gastrointestinal: Negative for abdominal pain.  Genitourinary: Negative for dysuria.  Musculoskeletal: Positive for joint pain. Negative for falls.  Skin: Negative for itching and rash.  Neurological: Negative for dizziness, loss of consciousness, weakness and headaches.  Psychiatric/Behavioral:       Increased stress    Health Maintenance  Topic Date Due  . Hepatitis C Screening  May 16, 1951  . TETANUS/TDAP  08/20/1968  . ZOSTAVAX  08/20/2009  . COLONOSCOPY  10/07/2015  .  INFLUENZA VACCINE  02/23/2016  . PNA vac Low Risk Adult (2 of 2 - PPSV23) 02/23/2016  . MAMMOGRAM  08/25/2016  . DEXA SCAN  Completed    Physical Exam: There were no vitals filed for this visit. There is no height or weight on file to calculate BMI. Physical Exam  Constitutional: She is oriented to person, place, and time. She appears well-developed and well-nourished. No distress.  Cardiovascular: Normal rate, regular rhythm, normal heart sounds and intact distal pulses.   Pulmonary/Chest: Effort normal and breath sounds normal. No respiratory distress.  Abdominal: Bowel sounds are normal.  Musculoskeletal: Normal range of motion.  Neurological: She is alert and oriented to person, place, and time.  Skin: Skin is warm and dry.  Psychiatric: She has a normal mood and  affect.    Labs reviewed: Basic Metabolic Panel:  Recent Labs  06/25/15 1101 10/27/15  NA 141 144  K 4.2 4.5  CL 100  --   CO2 26  --   GLUCOSE 99  --   BUN 12 14  CREATININE 0.70 0.8  CALCIUM 9.7  --    Liver Function Tests: No results for input(s): AST, ALT, ALKPHOS, BILITOT, PROT, ALBUMIN in the last 8760 hours. No results for input(s): LIPASE, AMYLASE in the last 8760 hours. No results for input(s): AMMONIA in the last 8760 hours. CBC:  Recent Labs  06/25/15 1101  WBC 6.3  NEUTROABS 2.7  HCT 40.7  MCV 87  PLT 302   Lipid Panel:  Recent Labs  06/25/15 1101 10/27/15  CHOL 265* 227*  HDL 44 56  LDLCALC 200* 150  TRIG 105 107  CHOLHDL 6.0*  --    Lab Results  Component Value Date   HGBA1C 6.0 10/27/2015    Assessment/Plan 1. Essential hypertension, benign -bp at goal with current therapy, cont same, may add lisinopril next time if bp elevated - hydrochlorothiazide (MICROZIDE) 12.5 MG capsule; Take 1 capsule (12.5 mg total) by mouth daily.  Dispense: 30 capsule; Refill: 3  2. Tobacco use disorder - due to pneumovax -continues to smoke and not ready to quit - Pneumococcal polysaccharide vaccine 23-valent greater than or equal to 2yo subcutaneous/IM  3. Hyperglycemia -sugar average recheck needed  4. Primary osteoarthritis of both knees -stable at this time, did not c/o much today  5. Hyperlipidemia - cont pravachol and recheck lipids when she is able to get off of work to come in (had put creamer in her coffee today) - pravastatin (PRAVACHOL) 40 MG tablet; Take 1 tablet (40 mg total) by mouth daily. For High Cholesterol  Dispense: 30 tablet; Refill: 3  6. Need for prophylactic vaccination against Streptococcus pneumoniae (pneumococcus) - Pneumococcal polysaccharide vaccine 23-valent greater than or equal to 2yo subcutaneous/IM  Labs/tests ordered:  Already has future labs that are good until early Dec  Next appt:  F/u in Dec   Amrutha Avera L.  Denica Web, D.O. [facility] [address], [place] [address] Cell Phone (Mon-Fri 8am-5pm):  [phone] On Call:  [phone] & follow prompts after 5pm & weekends Office Phone:  [phone] Office Fax:  [phone]

## 2016-04-21 DIAGNOSIS — M1990 Unspecified osteoarthritis, unspecified site: Secondary | ICD-10-CM | POA: Diagnosis not present

## 2016-04-21 DIAGNOSIS — E785 Hyperlipidemia, unspecified: Secondary | ICD-10-CM | POA: Diagnosis not present

## 2016-04-21 DIAGNOSIS — H5711 Ocular pain, right eye: Secondary | ICD-10-CM | POA: Diagnosis not present

## 2016-04-21 DIAGNOSIS — I10 Essential (primary) hypertension: Secondary | ICD-10-CM | POA: Diagnosis not present

## 2016-04-21 DIAGNOSIS — H10021 Other mucopurulent conjunctivitis, right eye: Secondary | ICD-10-CM | POA: Diagnosis not present

## 2016-05-10 ENCOUNTER — Other Ambulatory Visit: Payer: Self-pay | Admitting: *Deleted

## 2016-05-10 DIAGNOSIS — I10 Essential (primary) hypertension: Secondary | ICD-10-CM

## 2016-05-10 MED ORDER — HYDROCHLOROTHIAZIDE 12.5 MG PO CAPS: 12.5000 mg | ORAL_CAPSULE | Freq: Every day | ORAL | 3 refills | Status: DC

## 2016-05-10 NOTE — Telephone Encounter (Signed)
Patient requested to be sent to pharmacy.  

## 2016-07-21 ENCOUNTER — Telehealth: Payer: Self-pay | Admitting: Internal Medicine

## 2016-07-21 DIAGNOSIS — R739 Hyperglycemia, unspecified: Secondary | ICD-10-CM

## 2016-07-21 DIAGNOSIS — I10 Essential (primary) hypertension: Secondary | ICD-10-CM

## 2016-07-21 DIAGNOSIS — E7849 Other hyperlipidemia: Secondary | ICD-10-CM

## 2016-07-21 NOTE — Telephone Encounter (Signed)
left msg asking pt to confirm both the AWV and CPE appts. VDM (DD)

## 2016-08-04 DIAGNOSIS — H26492 Other secondary cataract, left eye: Secondary | ICD-10-CM | POA: Diagnosis not present

## 2016-08-04 DIAGNOSIS — H524 Presbyopia: Secondary | ICD-10-CM | POA: Diagnosis not present

## 2016-08-04 DIAGNOSIS — H5213 Myopia, bilateral: Secondary | ICD-10-CM | POA: Diagnosis not present

## 2016-08-04 DIAGNOSIS — Z961 Presence of intraocular lens: Secondary | ICD-10-CM | POA: Diagnosis not present

## 2016-08-15 NOTE — Telephone Encounter (Signed)
Left message asking pt to return my call, pt needs to schedule lab appt

## 2016-08-15 NOTE — Telephone Encounter (Signed)
-----   Message from Gayland Curry, DO sent at 08/15/2016  4:39 PM EST ----- Cbc with diff , cmp with gfr, flp, hba1c please  ----- Message ----- From: Despina Hidden, CMA Sent: 08/15/2016   3:18 PM To: Gayland Curry, DO  What labs should pt get? ----- Message ----- From: Gayland Curry, DO Sent: 08/11/2016   4:43 PM To: Despina Hidden, CMA  I see that she scheduled her CPE in may, but I had hoped she'd come see me last month and get her labs.  Is she willing or able to come soon for the labs and a visit?

## 2016-08-23 ENCOUNTER — Other Ambulatory Visit: Payer: Self-pay | Admitting: Internal Medicine

## 2016-08-23 DIAGNOSIS — E785 Hyperlipidemia, unspecified: Secondary | ICD-10-CM

## 2016-12-05 ENCOUNTER — Encounter: Payer: Self-pay | Admitting: Internal Medicine

## 2016-12-05 ENCOUNTER — Ambulatory Visit (INDEPENDENT_AMBULATORY_CARE_PROVIDER_SITE_OTHER): Payer: Medicare Other | Admitting: Internal Medicine

## 2016-12-05 ENCOUNTER — Ambulatory Visit: Payer: Medicare Other

## 2016-12-05 VITALS — BP 136/74 | HR 58 | Temp 98.8°F | Resp 12 | Ht 64.0 in | Wt 221.0 lb

## 2016-12-05 DIAGNOSIS — I10 Essential (primary) hypertension: Secondary | ICD-10-CM

## 2016-12-05 DIAGNOSIS — F17200 Nicotine dependence, unspecified, uncomplicated: Secondary | ICD-10-CM

## 2016-12-05 DIAGNOSIS — Z23 Encounter for immunization: Secondary | ICD-10-CM

## 2016-12-05 DIAGNOSIS — Z6837 Body mass index (BMI) 37.0-37.9, adult: Secondary | ICD-10-CM

## 2016-12-05 DIAGNOSIS — Z1231 Encounter for screening mammogram for malignant neoplasm of breast: Secondary | ICD-10-CM

## 2016-12-05 DIAGNOSIS — E049 Nontoxic goiter, unspecified: Secondary | ICD-10-CM

## 2016-12-05 DIAGNOSIS — E784 Other hyperlipidemia: Secondary | ICD-10-CM | POA: Diagnosis not present

## 2016-12-05 DIAGNOSIS — R739 Hyperglycemia, unspecified: Secondary | ICD-10-CM

## 2016-12-05 DIAGNOSIS — Z1211 Encounter for screening for malignant neoplasm of colon: Secondary | ICD-10-CM

## 2016-12-05 DIAGNOSIS — E7849 Other hyperlipidemia: Secondary | ICD-10-CM

## 2016-12-05 DIAGNOSIS — E2839 Other primary ovarian failure: Secondary | ICD-10-CM

## 2016-12-05 MED ORDER — TETANUS-DIPHTH-ACELL PERTUSSIS 5-2.5-18.5 LF-MCG/0.5 IM SUSP: 0.5000 mL | Freq: Once | INTRAMUSCULAR | 0 refills | Status: AC

## 2016-12-05 MED ORDER — ZOSTER VAC RECOMB ADJUVANTED 50 MCG/0.5ML IM SUSR: 0.5000 mL | Freq: Once | INTRAMUSCULAR | 1 refills | Status: AC

## 2016-12-05 MED ORDER — HYDROCHLOROTHIAZIDE 12.5 MG PO CAPS: 12.5000 mg | ORAL_CAPSULE | Freq: Every day | ORAL | 1 refills | Status: DC

## 2016-12-05 MED ORDER — PRAVASTATIN SODIUM 40 MG PO TABS: 40.0000 mg | ORAL_TABLET | Freq: Every day | ORAL | 3 refills | Status: AC

## 2016-12-05 NOTE — Progress Notes (Signed)
Provider:  Rexene Edison. Mariea Clonts, D.O., C.M.D. Location:   [place]   Place of Service:   clinic  Previous PCP: Gayland Curry, DO Patient Care Team: Gayland Curry, DO as PCP - General (Geriatric Medicine) Hilarie Fredrickson, Lajuan Lines, MD as Consulting Physician (Gastroenterology)  Extended Emergency Contact Information Primary Emergency Contact: Carmina Miller [place] of [place] Mobile Phone: [phone] Relation: Daughter  Code Status: full code Goals of Care: Advanced Directive information Advanced Directives 03/04/2016  Does Patient Have a Medical Advance Directive? Yes  Type of Paramedic of [place];Living will  Does patient want to make changes to medical advance directive? -  Copy of [place] in Chart? Yes  Would patient like information on creating a medical advance directive? -   Chief Complaint  Patient presents with  . Medical Management of Chronic Issues    Extended visit for annual check-up on chroinc conditions, + fall risk, - Depression, MMSE(30/30, passed clock drawing) and EKG  . Medication Refill    Refill medications- [facility]   . Referral    Mammogram, Bone Density and colonoscopy (set up on a Friday)     HPI: Patient is a 66 y.o. female seen today for an annual physical exam.    Will need to reschedule AWV portion of visit.   Has gone back to work.  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  Going to do one summer session.  She is enjoying it.    Still has occasional stomach pain.  Will be painful and then resolve.    She had her cataract surgery a couple of years ago and needs a film dissolved from behind her left eye.  Now she thinks one is creeping up in right eye also.  Going to go soon.  Has huge copay.    Left knee bothersome at times and will occasionally give out.  Gets stiff and painful.  Still better than it was before her injection.  Tobacco abuse:  Does not smoke as much as she did, but  is not ready to quit.    Has fh/o thyroid goiter in her mother which was larger on the right just like her own.  Has never had US done.  Does not tolerate heat well.    Hyperlipidemia:  Has not been back for labs (not convenient to come into [place] and working all morning Mon-Thurs).  Requests as many things be done in [facility] as possible.    Past Medical History:  Diagnosis Date  . Abdominal pain   . Abdominal pain, epigastric   . Abdominal tenderness, periumbilic   . Allergy   . Arthritis   . Cataract    Bil  . Family history of diabetes mellitus   . GERD (gastroesophageal reflux disease)   . Hyperlipidemia    no meds  . Hypertension   . Lumbago   . Nontoxic uninodular goiter   . Pain in joint, site unspecified   . Routine general medical examination at a health care facility   . Tobacco use disorder   . Unspecified essential hypertension    Past Surgical History:  Procedure Laterality Date  . ABDOMINAL HYSTERECTOMY    . CATARACTS     09/24/14,10/01/14  . CHOLECYSTECTOMY    . COLONOSCOPY    . MOUTH SURGERY  03/31/2015    reports that she has been smoking Cigarettes.  She has been smoking about 0.25 packs per day. She has  never used smokeless tobacco. She reports that she does not drink alcohol or use drugs.  Functional Status Survey:    Family History  Problem Relation Age of Onset  . Cancer Mother        breast  . Diabetes Father   . Heart disease Father   . Colon cancer Neg Hx   . Esophageal cancer Neg Hx   . Rectal cancer Neg Hx   . Stomach cancer Neg Hx     Health Maintenance  Topic Date Due  . Hepatitis C Screening  August 03, 1950  . TETANUS/TDAP  08/20/1968  . COLONOSCOPY  10/07/2015  . MAMMOGRAM  08/25/2016  . INFLUENZA VACCINE  02/22/2017  . DEXA SCAN  Completed  . PNA vac Low Risk Adult  Completed    No Known Allergies  Allergies as of 12/05/2016   No Known Allergies     Medication List       Accurate as of 12/05/16  1:54 PM. Always use your  most recent med list.          hydrochlorothiazide 12.5 MG capsule Commonly known as:  MICROZIDE Take 1 capsule (12.5 mg total) by mouth daily.   pravastatin 40 MG tablet Commonly known as:  PRAVACHOL take 1 tablet by mouth once daily for cholesterol   Tdap 5-2.5-18.5 LF-MCG/0.5 injection Commonly known as:  BOOSTRIX Inject 0.5 mLs into the muscle once.   Zoster Vac Recomb Adjuvanted injection Commonly known as:  SHINGRIX Inject 0.5 mLs into the muscle once.      Review of Systems  Constitutional: Positive for malaise/fatigue. Negative for chills and fever.  HENT: Negative for congestion and hearing loss.        Large right thyroid goiter  Eyes: Positive for blurred vision.  Respiratory: Negative for cough and shortness of breath.        Still smoking  Cardiovascular: Negative for chest pain, palpitations and leg swelling.  Gastrointestinal: Positive for abdominal pain. Negative for blood in stool, constipation, diarrhea, heartburn, melena, nausea and vomiting.  Genitourinary: Negative for dysuria.  Musculoskeletal: Positive for joint pain. Negative for falls.  Skin: Positive for rash.       Dry skin of right palm, left lateral shin, but nothing like it's been the prior couple of years  Neurological: Negative for dizziness, loss of consciousness and weakness.  Endo/Heme/Allergies: Does not bruise/bleed easily.  Psychiatric/Behavioral: Negative for depression and memory loss.    Vitals:   12/05/16 1347  BP: 140/60  Pulse: (!) 58  Resp: 12  Temp: 98.8 F (37.1 C)  TempSrc: Oral  SpO2: 97%  Weight: 221 lb (100.2 kg)  Height: 5\' 4"  (1.626 m)   Body mass index is 37.93 kg/m. Physical Exam  Constitutional: She is oriented to person, place, and time. She appears well-developed and well-nourished. No distress.  HENT:  Head: Normocephalic and atraumatic.  Right Ear: External ear normal.  Left Ear: External ear normal.  Nose: Nose normal.  Mouth/Throat: Oropharynx  is clear and moist. No oropharyngeal exudate.  Increased cerumen  Eyes: Conjunctivae and EOM are normal. Pupils are equal, round, and reactive to light.  Neck: Neck supple. No JVD present. Thyromegaly present.  Right greater than left thyromegaly  Cardiovascular: Normal rate, regular rhythm, normal heart sounds and intact distal pulses.   Pulmonary/Chest: Effort normal and breath sounds normal. No respiratory distress. She has no wheezes. Right breast exhibits no inverted nipple, no mass, no nipple discharge, no skin change and no tenderness. Left breast  exhibits no inverted nipple, no mass, no nipple discharge, no skin change and no tenderness.  Abdominal: Soft. Bowel sounds are normal. She exhibits no distension. There is no tenderness.  Musculoskeletal: Normal range of motion. She exhibits tenderness.  Bilateral knees, left greater than right  Lymphadenopathy:    She has no cervical adenopathy.  Neurological: She is alert and oriented to person, place, and time.  Skin: Skin is warm and dry. Capillary refill takes less than 2 seconds.  Psychiatric: She has a normal mood and affect.    Labs reviewed: Basic Metabolic Panel: No results for input(s): NA, K, CL, CO2, GLUCOSE, BUN, CREATININE, CALCIUM, MG, PHOS in the last 8760 hours. Liver Function Tests: No results for input(s): AST, ALT, ALKPHOS, BILITOT, PROT, ALBUMIN in the last 8760 hours. No results for input(s): LIPASE, AMYLASE in the last 8760 hours. No results for input(s): AMMONIA in the last 8760 hours. CBC: No results for input(s): WBC, NEUTROABS, HGB, HCT, MCV, PLT in the last 8760 hours. Cardiac Enzymes: No results for input(s): CKTOTAL, CKMB, CKMBINDEX, TROPONINI in the last 8760 hours. BNP: Invalid input(s): POCBNP Lab Results  Component Value Date   HGBA1C 6.0 10/27/2015   Lab Results  Component Value Date   TSH 0.845 07/28/2014   Imaging and Procedures Recently: EKG today:  NSR at 54bpm, no changes from  10/29/15  Assessment/Plan 1. Class 2 severe obesity due to excess calories with serious comorbidity and body mass index (BMI) of 37.0 to 37.9 in adult [facility]) -pt does not exercise much, has been counseled to do so -trying to improve her diet  2. Other hyperlipidemia - EKG 12-Lead - needs FLP recheck--Rx given to get [facility] - pravastatin (PRAVACHOL) 40 MG tablet; Take 1 tablet (40 mg total) by mouth daily.  Dispense: 90 tablet; Refill: 3  3. Essential hypertension, benign -bp improved on recheck to 136/74, continues to smoke cigarettes, using her hctz as directed and trying to eat low sodium diet - EKG 12-Lead was wnl - hydrochlorothiazide (MICROZIDE) 12.5 MG capsule; Take 1 capsule (12.5 mg total) by mouth daily.  Dispense: 90 capsule; Refill: 1 - f/u cmp with gfr  4. Hyperglycemia - cont to work on diet and exercise and f/u hba1c - EKG 12-Lead  5. Tobacco use disorder - ongoing, says she knows benefits of quitting but is not currently ready to do so despite risks of ongoing cigarette smoking - EKG 12-Lead  6. Screening for colon cancer - due for recheck due to h/o adenomatous polyps - Ambulatory referral to Gastroenterology--ideally should be at [facility] (Dr. Hilarie Fredrickson) where she went before, but she is preferring to go someone in [facility] due to proximity to her home  7. Estrogen deficiency -due for repeat dexa with mammogram - DG Bone Density; Future -high risk for OP due to her tobacco abuse  8. Thyroid goiter - seems to be getting bigger  -check tsh, free t4 and antitpo abs - US THYROID; Future   9. Need for Tdap vaccination - Tdap ([place]) 5-2.5-18.5 LF-MCG/0.5 injection; Inject 0.5 mLs into the muscle once.  Dispense: 0.5 mL; Refill: 0-RX given  10. Need for shingles vaccine - Zoster Vac Recomb Adjuvanted [facility]) injection; Inject 0.5 mLs into the muscle once.  Dispense: 0.5 mL; Refill: 1--Rx given to get at pharmacy series of 2 2-6 mos  apart  Labs/tests ordered:  Cbc, cmp, flp, hba1c, tsh, free t4 and tpo abs f/u in 4 mos for med mgt here  Advised to reschedule wellness visit for an afternoon (missed today)  Oz Gammel L. Gabrial Domine, D.O. [employer] [address], [place] [address] Cell Phone (Mon-Fri 8am-5pm):  [phone] On Call:  [phone] & follow prompts after 5pm & weekends Office Phone:  [phone] Office Fax:  [phone]

## 2017-01-15 DIAGNOSIS — R0789 Other chest pain: Secondary | ICD-10-CM | POA: Diagnosis not present

## 2017-01-15 DIAGNOSIS — R079 Chest pain, unspecified: Secondary | ICD-10-CM | POA: Diagnosis not present

## 2017-01-15 DIAGNOSIS — E7800 Pure hypercholesterolemia, unspecified: Secondary | ICD-10-CM | POA: Diagnosis not present

## 2017-01-15 DIAGNOSIS — M791 Myalgia: Secondary | ICD-10-CM | POA: Diagnosis not present

## 2017-01-15 DIAGNOSIS — M25511 Pain in right shoulder: Secondary | ICD-10-CM | POA: Diagnosis not present

## 2017-01-15 DIAGNOSIS — M1990 Unspecified osteoarthritis, unspecified site: Secondary | ICD-10-CM | POA: Diagnosis not present

## 2017-01-15 DIAGNOSIS — M542 Cervicalgia: Secondary | ICD-10-CM | POA: Diagnosis not present

## 2017-01-15 DIAGNOSIS — I10 Essential (primary) hypertension: Secondary | ICD-10-CM | POA: Diagnosis not present

## 2017-01-15 DIAGNOSIS — E785 Hyperlipidemia, unspecified: Secondary | ICD-10-CM | POA: Diagnosis not present

## 2017-01-17 ENCOUNTER — Ambulatory Visit (INDEPENDENT_AMBULATORY_CARE_PROVIDER_SITE_OTHER): Payer: Medicare Other

## 2017-01-17 VITALS — BP 140/60 | HR 72 | Temp 98.4°F | Ht 64.0 in | Wt 222.0 lb

## 2017-01-17 DIAGNOSIS — Z0000 Encounter for general adult medical examination without abnormal findings: Secondary | ICD-10-CM | POA: Diagnosis not present

## 2017-01-17 DIAGNOSIS — Z723 Lack of physical exercise: Secondary | ICD-10-CM | POA: Diagnosis not present

## 2017-01-17 NOTE — Progress Notes (Addendum)
Subjective:   Rachel York is a 66 y.o. female who presents for Medicare Annual (Subsequent) preventive examination.       Objective:     Vitals: BP 140/60 (BP Location: Left Arm, Patient Position: Sitting)   Pulse 72   Temp 98.4 F (36.9 C) (Oral)   Ht '5\' 4"'  (1.626 m)   Wt 222 lb (100.7 kg)   SpO2 97%   BMI 38.11 kg/m   Body mass index is 38.11 kg/m.   Tobacco History  Smoking Status  . Current Every Day Smoker  . Packs/day: 1.00  . Years: 50.00  . Types: Cigarettes  Smokeless Tobacco  . Never Used     Ready to quit: Not Answered Counseling given: Not Answered   Past Medical History:  Diagnosis Date  . Abdominal pain   . Abdominal pain, epigastric   . Abdominal tenderness, periumbilic   . Allergy   . Arthritis   . Cataract    Bil  . Family history of diabetes mellitus   . GERD (gastroesophageal reflux disease)   . Hyperlipidemia    no meds  . Hypertension   . Lumbago   . Nontoxic uninodular goiter   . Pain in joint, site unspecified   . Routine general medical examination at a health care facility   . Tobacco use disorder   . Unspecified essential hypertension    Past Surgical History:  Procedure Laterality Date  . ABDOMINAL HYSTERECTOMY    . CATARACTS     09/24/14,10/01/14  . CHOLECYSTECTOMY    . COLONOSCOPY    . MOUTH SURGERY  03/31/2015   Family History  Problem Relation Age of Onset  . Cancer Mother        breast  . Diabetes Father   . Heart disease Father   . Colon cancer Neg Hx   . Esophageal cancer Neg Hx   . Rectal cancer Neg Hx   . Stomach cancer Neg Hx    History  Sexual Activity  . Sexual activity: Yes  . Partners: Male    Outpatient Encounter Prescriptions as of 01/17/2017  Medication Sig  . famotidine (PEPCID) 20 MG tablet Take 20 mg by mouth.  . hydrochlorothiazide (MICROZIDE) 12.5 MG capsule Take 1 capsule (12.5 mg total) by mouth daily.  Marland Kitchen ibuprofen (ADVIL,MOTRIN) 800 MG tablet Take 800 mg by mouth.  .  pravastatin (PRAVACHOL) 40 MG tablet Take 1 tablet (40 mg total) by mouth daily.   No facility-administered encounter medications on file as of 01/17/2017.     Activities of Daily Living In your present state of health, do you have any difficulty performing the following activities: 01/17/2017  Hearing? N  Vision? Y  Difficulty concentrating or making decisions? N  Walking or climbing stairs? Y  Dressing or bathing? N  Doing errands, shopping? N  Preparing Food and eating ? N  Using the Toilet? N  In the past six months, have you accidently leaked urine? N  Do you have problems with loss of bowel control? N  Managing your Medications? N  Managing your Finances? N  Housekeeping or managing your Housekeeping? N  Some recent data might be hidden    Patient Care Team: Gayland Curry, DO as PCP - General (Geriatric Medicine) Hilarie Fredrickson, Lajuan Lines, MD as Consulting Physician (Gastroenterology)    Assessment:     Exercise Activities and Dietary recommendations Current Exercise Habits: The patient does not participate in regular exercise at present, Exercise limited by: None identified  Goals    . Quit smoking / using tobacco    . Tai Chi          Patient will start doing tai chi.       Fall Risk Fall Risk  01/17/2017 12/05/2016 03/04/2016 10/29/2015 02/23/2015  Falls in the past year? No Yes No No No  Number falls in past yr: - 2 or more - - -  Injury with Fall? - No - - -   Depression Screen PHQ 2/9 Scores 01/17/2017 12/05/2016 03/04/2016 10/29/2015  PHQ - 2 Score 0 0 0 4  PHQ- 9 Score - - - 15     Cognitive Function MMSE - Mini Mental State Exam 12/05/2016 10/29/2015  Not completed: - (No Data)  Orientation to time 5 4  Orientation to Place 5 5  Registration 3 3  Attention/ Calculation 5 5  Recall 3 3  Language- name 2 objects 2 2  Language- repeat 1 1  Language- follow 3 step command 3 3  Language- read & follow direction 1 1  Write a sentence 1 1  Copy design 1 1  Total score  30 29        Immunization History  Administered Date(s) Administered  . Influenza,inj,Quad PF,36+ Mos 04/29/2013, 07/28/2014, 06/25/2015  . Influenza-Unspecified 08/23/2016  . Pneumococcal Conjugate-13 02/23/2015  . Pneumococcal Polysaccharide-23 03/04/2016   Screening Tests Health Maintenance  Topic Date Due  . Hepatitis C Screening  April 26, 1951  . TETANUS/TDAP  08/20/1968  . COLONOSCOPY  10/07/2015  . MAMMOGRAM  08/25/2016  . INFLUENZA VACCINE  02/22/2017  . DEXA SCAN  Completed  . PNA vac Low Risk Adult  Completed      Plan:    I have personally reviewed and addressed the Medicare Annual Wellness questionnaire and have noted the following in the patient's chart:  A. Medical and social history B. Use of alcohol, tobacco or illicit drugs  C. Current medications and supplements D. Functional ability and status E.  Nutritional status F.  Physical activity G. Advance directives H. List of other physicians I.  Hospitalizations, surgeries, and ER visits in previous 12 months J.  [place] to include hearing, vision, cognitive, depression L. Referrals and appointments - none  In addition, I have reviewed and discussed with patient certain preventive protocols, quality metrics, and best practice recommendations. A written personalized care plan for preventive services as well as general preventive health recommendations were provided to patient.  See attached scanned questionnaire for additional information.   Signed,   Rich Reining, RN Nurse Health Advisor   Quick Notes   Health Maintenance: TDAP and Hep C due, mmg due-referral in.      Abnormal Screen: MMSE 30/30 last month     Patient Concerns: None     Nurse Concerns: Encouraged patient to call [facility] in [place] and thyroid for appointments

## 2017-01-17 NOTE — Addendum Note (Signed)
Addended by: Rich Reining E on: 01/17/2017 02:17 PM   Modules accepted: Orders

## 2017-01-17 NOTE — Patient Instructions (Signed)
Rachel York , Thank you for taking time to come for your Medicare Wellness Visit. I appreciate your ongoing commitment to your health goals. Please review the following plan we discussed and let me know if I can assist you in the future.   Screening recommendations/referrals: Colonoscopy up to date. Due 10/06/2024 Mammogram up to date. Due 08/25/2016 Bone Density up to date Recommended yearly ophthalmology/optometry visit for glaucoma screening and checkup Recommended yearly dental visit for hygiene and checkup  Vaccinations: Influenza vaccine due in the fall season Pneumococcal vaccine up to date Tdap vaccine due Shingles vaccine due, if you would like this vaccine we will send in prescription  Advanced directives: In Chart  Conditions/risks identified: Please call back for your mammogram and your thyroid appointment.  Next appointment: None scheduled   Preventive Care 17 Years and Older, Female Preventive care refers to lifestyle choices and visits with your health care provider that can promote health and wellness. What does preventive care include?  A yearly physical exam. This is also called an annual well check.  Dental exams once or twice a year.  Routine eye exams. Ask your health care provider how often you should have your eyes checked.  Personal lifestyle choices, including:  Daily care of your teeth and gums.  Regular physical activity.  Eating a healthy diet.  Avoiding tobacco and drug use.  Limiting alcohol use.  Practicing safe sex.  Taking low-dose aspirin every day.  Taking vitamin and mineral supplements as recommended by your health care provider. What happens during an annual well check? The services and screenings done by your health care provider during your annual well check will depend on your age, overall health, lifestyle risk factors, and family history of disease. Counseling  Your health care provider may ask you questions about  your:  Alcohol use.  Tobacco use.  Drug use.  Emotional well-being.  Home and relationship well-being.  Sexual activity.  Eating habits.  History of falls.  Memory and ability to understand (cognition).  Work and work Statistician.  Reproductive health. Screening  You may have the following tests or measurements:  Height, weight, and BMI.  Blood pressure.  Lipid and cholesterol levels. These may be checked every 5 years, or more frequently if you are over 68 years old.  Skin check.  Lung cancer screening. You may have this screening every year starting at age 20 if you have a 30-pack-year history of smoking and currently smoke or have quit within the past 15 years.  Fecal occult blood test (FOBT) of the stool. You may have this test every year starting at age 49.  Flexible sigmoidoscopy or colonoscopy. You may have a sigmoidoscopy every 5 years or a colonoscopy every 10 years starting at age 22.  Hepatitis C blood test.  Hepatitis B blood test.  Sexually transmitted disease (STD) testing.  Diabetes screening. This is done by checking your blood sugar (glucose) after you have not eaten for a while (fasting). You may have this done every 1-3 years.  Bone density scan. This is done to screen for osteoporosis. You may have this done starting at age 55.  Mammogram. This may be done every 1-2 years. Talk to your health care provider about how often you should have regular mammograms. Talk with your health care provider about your test results, treatment options, and if necessary, the need for more tests. Vaccines  Your health care provider may recommend certain vaccines, such as:  Influenza vaccine. This is recommended every  year.  Tetanus, diphtheria, and acellular pertussis (Tdap, Td) vaccine. You may need a Td booster every 10 years.  Zoster vaccine. You may need this after age 10.  Pneumococcal 13-valent conjugate (PCV13) vaccine. One dose is recommended  after age 61.  Pneumococcal polysaccharide (PPSV23) vaccine. One dose is recommended after age 53. Talk to your health care provider about which screenings and vaccines you need and how often you need them. This information is not intended to replace advice given to you by your health care provider. Make sure you discuss any questions you have with your health care provider. Document Released: 08/07/2015 Document Revised: 03/30/2016 Document Reviewed: 05/12/2015 Elsevier Interactive Patient Education  2017 [place] Prevention in the Home Falls can cause injuries. They can happen to people of all ages. There are many things you can do to make your home safe and to help prevent falls. What can I do on the outside of my home?  Regularly fix the edges of walkways and driveways and fix any cracks.  Remove anything that might make you trip as you walk through a door, such as a raised step or threshold.  Trim any bushes or trees on the path to your home.  Use bright outdoor lighting.  Clear any walking paths of anything that might make someone trip, such as rocks or tools.  Regularly check to see if handrails are loose or broken. Make sure that both sides of any steps have handrails.  Any raised decks and porches should have guardrails on the edges.  Have any leaves, snow, or ice cleared regularly.  Use sand or salt on walking paths during winter.  Clean up any spills in your garage right away. This includes oil or grease spills. What can I do in the bathroom?  Use night lights.  Install grab bars by the toilet and in the tub and shower. Do not use towel bars as grab bars.  Use non-skid mats or decals in the tub or shower.  If you need to sit down in the shower, use a plastic, non-slip stool.  Keep the floor dry. Clean up any water that spills on the floor as soon as it happens.  Remove soap buildup in the tub or shower regularly.  Attach bath mats securely with  double-sided non-slip rug tape.  Do not have throw rugs and other things on the floor that can make you trip. What can I do in the bedroom?  Use night lights.  Make sure that you have a light by your bed that is easy to reach.  Do not use any sheets or blankets that are too big for your bed. They should not hang down onto the floor.  Have a firm chair that has side arms. You can use this for support while you get dressed.  Do not have throw rugs and other things on the floor that can make you trip. What can I do in the kitchen?  Clean up any spills right away.  Avoid walking on wet floors.  Keep items that you use a lot in easy-to-reach places.  If you need to reach something above you, use a strong step stool that has a grab bar.  Keep electrical cords out of the way.  Do not use floor polish or wax that makes floors slippery. If you must use wax, use non-skid floor wax.  Do not have throw rugs and other things on the floor that can make you trip. What can  I do with my stairs?  Do not leave any items on the stairs.  Make sure that there are handrails on both sides of the stairs and use them. Fix handrails that are broken or loose. Make sure that handrails are as long as the stairways.  Check any carpeting to make sure that it is firmly attached to the stairs. Fix any carpet that is loose or worn.  Avoid having throw rugs at the top or bottom of the stairs. If you do have throw rugs, attach them to the floor with carpet tape.  Make sure that you have a light switch at the top of the stairs and the bottom of the stairs. If you do not have them, ask someone to add them for you. What else can I do to help prevent falls?  Wear shoes that:  Do not have high heels.  Have rubber bottoms.  Are comfortable and fit you well.  Are closed at the toe. Do not wear sandals.  If you use a stepladder:  Make sure that it is fully opened. Do not climb a closed stepladder.  Make  sure that both sides of the stepladder are locked into place.  Ask someone to hold it for you, if possible.  Clearly mark and make sure that you can see:  Any grab bars or handrails.  First and last steps.  Where the edge of each step is.  Use tools that help you move around (mobility aids) if they are needed. These include:  Canes.  Walkers.  Scooters.  Crutches.  Turn on the lights when you go into a dark area. Replace any light bulbs as soon as they burn out.  Set up your furniture so you have a clear path. Avoid moving your furniture around.  If any of your floors are uneven, fix them.  If there are any pets around you, be aware of where they are.  Review your medicines with your doctor. Some medicines can make you feel dizzy. This can increase your chance of falling. Ask your doctor what other things that you can do to help prevent falls. This information is not intended to replace advice given to you by your health care provider. Make sure you discuss any questions you have with your health care provider. Document Released: 05/07/2009 Document Revised: 12/17/2015 Document Reviewed: 08/15/2014 Elsevier Interactive Patient Education  2017 [employer].

## 2017-01-24 ENCOUNTER — Encounter: Payer: Self-pay | Admitting: Internal Medicine

## 2017-02-02 ENCOUNTER — Encounter: Payer: Self-pay | Admitting: Internal Medicine

## 2017-02-17 ENCOUNTER — Encounter: Payer: Medicare Other | Admitting: Internal Medicine

## 2017-04-10 ENCOUNTER — Ambulatory Visit (AMBULATORY_SURGERY_CENTER): Payer: Self-pay | Admitting: *Deleted

## 2017-04-10 VITALS — Ht 64.5 in | Wt 221.0 lb

## 2017-04-10 DIAGNOSIS — Z86010 Personal history of colonic polyps: Secondary | ICD-10-CM

## 2017-04-10 MED ORDER — NA SULFATE-K SULFATE-MG SULF 17.5-3.13-1.6 GM/177ML PO SOLN: 1.0000 | Freq: Once | ORAL | 0 refills | Status: AC

## 2017-04-10 NOTE — Progress Notes (Signed)
No egg or soy allergy known to patient  No issues with past sedation with any surgeries  or procedures, no intubation problems  No diet pills per patient No home 02 use per patient  No blood thinners per patient  Pt denies issues with constipation  No A fib or A flutter  EMMI video sent to pt's e mail - pt declined PER [facility] PT'S PREP IS 45$ - pt aware in PV today

## 2017-04-12 ENCOUNTER — Encounter: Payer: Self-pay | Admitting: Internal Medicine

## 2017-04-17 ENCOUNTER — Telehealth: Payer: Self-pay | Admitting: Internal Medicine

## 2017-04-17 NOTE — Telephone Encounter (Signed)
Noted  

## 2017-04-18 ENCOUNTER — Encounter: Payer: Medicare Other | Admitting: Internal Medicine

## 2017-04-24 ENCOUNTER — Other Ambulatory Visit: Payer: Self-pay | Admitting: Internal Medicine

## 2017-04-24 DIAGNOSIS — I10 Essential (primary) hypertension: Secondary | ICD-10-CM

## 2017-04-27 ENCOUNTER — Encounter: Payer: Medicare Other | Admitting: Internal Medicine

## 2017-05-09 ENCOUNTER — Ambulatory Visit (AMBULATORY_SURGERY_CENTER): Payer: Medicare Other | Admitting: Internal Medicine

## 2017-05-09 ENCOUNTER — Encounter: Payer: Self-pay | Admitting: Internal Medicine

## 2017-05-09 VITALS — BP 141/67 | HR 54 | Temp 98.0°F | Resp 10 | Ht 64.5 in | Wt 221.0 lb

## 2017-05-09 DIAGNOSIS — Z86010 Personal history of colonic polyps: Secondary | ICD-10-CM | POA: Diagnosis present

## 2017-05-09 DIAGNOSIS — D125 Benign neoplasm of sigmoid colon: Secondary | ICD-10-CM

## 2017-05-09 DIAGNOSIS — K635 Polyp of colon: Secondary | ICD-10-CM | POA: Diagnosis not present

## 2017-05-09 DIAGNOSIS — D124 Benign neoplasm of descending colon: Secondary | ICD-10-CM

## 2017-05-09 MED ORDER — SODIUM CHLORIDE 0.9 % IV SOLN: 500.0000 mL | INTRAVENOUS | Status: AC

## 2017-05-09 NOTE — Progress Notes (Signed)
Report to PACU, RN, vss, BBS= Clear.  

## 2017-05-09 NOTE — Op Note (Signed)
[facility] Patient Name: Rachel York Procedure Date: 05/09/2017 2:20 PM MRN: [id number] Endoscopist: Jerene Bears , MD Age: 66 Referring MD:  Date of Birth: 1950-11-05 Gender: Female Account #: [id number] Procedure:                Colonoscopy Indications:              High risk colon cancer surveillance: Personal                            history of colonic polyps (personal history of                            multiple colonic polyps, predominantly left-sided,                            history of adenomatous polyps, sessie serrated                            polyps and multiple hyperplastic polyps); last                            colonoscopy 09/2014 Medicines:                Monitored Anesthesia Care Procedure:                Pre-Anesthesia Assessment:                           - Prior to the procedure, a History and Physical                            was performed, and patient medications and                            allergies were reviewed. The patient's tolerance of                            previous anesthesia was also reviewed. The risks                            and benefits of the procedure and the sedation                            options and risks were discussed with the patient.                            All questions were answered, and informed consent                            was obtained. Prior Anticoagulants: The patient has                            taken no previous anticoagulant or antiplatelet  agents. ASA Grade Assessment: II - A patient with                            mild systemic disease. After reviewing the risks                            and benefits, the patient was deemed in                            satisfactory condition to undergo the procedure.                           After obtaining informed consent, the colonoscope                            was passed under direct vision. Throughout the                       procedure, the patient's blood pressure, pulse, and                            oxygen saturations were monitored continuously. The                            Colonoscope was introduced through the anus and                            advanced to the the cecum, identified by                            appendiceal orifice and ileocecal valve. The                            colonoscopy was performed without difficulty. The                            patient tolerated the procedure well. The quality                            of the bowel preparation was excellent. The                            ileocecal valve, appendiceal orifice, and rectum                            were photographed. Scope In: 2:35:29 PM Scope Out: 2:58:08 PM Scope Withdrawal Time: 0 hours 19 minutes 38 seconds  Total Procedure Duration: 0 hours 22 minutes 39 seconds  Findings:                 The perianal and digital rectal examinations were                            normal.  Three sessile polyps were found in the descending                            colon. The polyps were 4 to 6 mm in size. These                            polyps were removed with a cold snare. Resection                            and retrieval were complete.                           14 sessile polyps were found in the recto-sigmoid                            colon and sigmoid colon. The polyps were 3 to 6 mm                            in size. These polyps were removed with a cold                            snare. Resection and retrieval were complete.                           The retroflexed view of the distal rectum and anal                            verge was normal and showed no anal or rectal                            abnormalities. Complications:            No immediate complications. Estimated Blood Loss:     Estimated blood loss was minimal. Impression:               - Three 4 to 6 mm polyps  in the descending colon,                            removed with a cold snare. Resected and retrieved.                           - Fourteen 3 to 6 mm polyps at the recto-sigmoid                            colon and in the sigmoid colon, removed with a cold                            snare. Resected and retrieved.                           - The distal rectum and anal verge are normal on  retroflexion view. Recommendation:           - Patient has a contact number available for                            emergencies. The signs and symptoms of potential                            delayed complications were discussed with the                            patient. Return to normal activities tomorrow.                            Written discharge instructions were provided to the                            patient.                           - Resume previous diet.                           - Continue present medications.                           - Await pathology results.                           - Repeat colonoscopy is recommended for                            surveillance of multiple polyps. The colonoscopy                            date will be determined after pathology results                            from today's exam become available for review. Jerene Bears, MD 05/09/2017 3:06:58 PM This report has been signed electronically.

## 2017-05-09 NOTE — Progress Notes (Signed)
Called to room to assist during endoscopic procedure.  Patient ID and intended procedure confirmed with present staff. Received instructions for my participation in the procedure from the performing physician.  

## 2017-05-09 NOTE — Patient Instructions (Signed)
   Information on polyps given to you today   Await pathology results   YOU HAD AN ENDOSCOPIC PROCEDURE TODAY AT [facility]:   Refer to the procedure report that was given to you for any specific questions about what was found during the examination.  If the procedure report does not answer your questions, please call your gastroenterologist to clarify.  If you requested that your care partner not be given the details of your procedure findings, then the procedure report has been included in a sealed envelope for you to review at your convenience later.  YOU SHOULD EXPECT: Some feelings of bloating in the abdomen. Passage of more gas than usual.  Walking can help get rid of the air that was put into your GI tract during the procedure and reduce the bloating. If you had a lower endoscopy (such as a colonoscopy or flexible sigmoidoscopy) you may notice spotting of blood in your stool or on the toilet paper. If you underwent a bowel prep for your procedure, you may not have a normal bowel movement for a few days.  Please Note:  You might notice some irritation and congestion in your nose or some drainage.  This is from the oxygen used during your procedure.  There is no need for concern and it should clear up in a day or so.  SYMPTOMS TO REPORT IMMEDIATELY:   Following lower endoscopy (colonoscopy or flexible sigmoidoscopy):  Excessive amounts of blood in the stool  Significant tenderness or worsening of abdominal pains  Swelling of the abdomen that is new, acute  Fever of 100F or higher    For urgent or emergent issues, a gastroenterologist can be reached at any hour by calling [phone].   DIET:  We do recommend a small meal at first, but then you may proceed to your regular diet.  Drink plenty of fluids but you should avoid alcoholic beverages for 24 hours.  ACTIVITY:  You should plan to take it easy for the rest of today and you should NOT DRIVE or use heavy  machinery until tomorrow (because of the sedation medicines used during the test).    FOLLOW UP: Our staff will call the number listed on your records the next business day following your procedure to check on you and address any questions or concerns that you may have regarding the information given to you following your procedure. If we do not reach you, we will leave a message.  However, if you are feeling well and you are not experiencing any problems, there is no need to return our call.  We will assume that you have returned to your regular daily activities without incident.  If any biopsies were taken you will be contacted by phone or by letter within the next 1-3 weeks.  Please call us at [phone] if you have not heard about the biopsies in 3 weeks.    SIGNATURES/CONFIDENTIALITY: You and/or your care partner have signed paperwork which will be entered into your electronic medical record.  These signatures attest to the fact that that the information above on your After Visit Summary has been reviewed and is understood.  Full responsibility of the confidentiality of this discharge information lies with you and/or your care-partner.

## 2017-05-10 ENCOUNTER — Telehealth: Payer: Self-pay | Admitting: *Deleted

## 2017-05-10 NOTE — Telephone Encounter (Signed)
  Follow up Call-  Call back number 05/09/2017 10/07/2014  Post procedure Call Back phone  # [phone]  Permission to leave phone message Yes Yes  Some recent data might be hidden     Patient questions:  Do you have a fever, pain , or abdominal swelling? No. Pain Score  0 *  Have you tolerated food without any problems? Yes.    Have you been able to return to your normal activities? Yes.    Do you have any questions about your discharge instructions: Diet   No. Medications  No. Follow up visit  No.  Do you have questions or concerns about your Care? No.  Actions: * If pain score is 4 or above: No action needed, pain <4.

## 2017-05-15 ENCOUNTER — Encounter: Payer: Self-pay | Admitting: Internal Medicine

## 2017-07-31 ENCOUNTER — Other Ambulatory Visit: Payer: Self-pay | Admitting: Internal Medicine

## 2017-07-31 DIAGNOSIS — I10 Essential (primary) hypertension: Secondary | ICD-10-CM

## 2017-11-21 ENCOUNTER — Telehealth: Payer: Self-pay | Admitting: Internal Medicine

## 2017-11-21 NOTE — Telephone Encounter (Signed)
I called the pt to schedule AWV and follow up appt.  She stated that she has changed doctors because it was starting to be too much for her coming back and forth to [place].  She now see Tami Ribas.  VDM (DD)

## 2019-04-25 DEATH — deceased

## 2019-05-26 DEATH — deceased

## 2020-09-07 ENCOUNTER — Encounter: Payer: Self-pay | Admitting: Internal Medicine
